# Patient Record
Sex: Male | Born: 1995 | Race: Black or African American | Hispanic: No | Marital: Single | Smoking: Never smoker
Health system: Southern US, Community
[De-identification: ages and names within clinical notes are randomized; demographics above are authoritative.]

## PROBLEM LIST (undated history)

## (undated) DIAGNOSIS — E119 Type 2 diabetes mellitus without complications: Secondary | ICD-10-CM

---

## 2019-12-08 ENCOUNTER — Emergency Department (HOSPITAL_COMMUNITY): Payer: Medicaid Other

## 2019-12-08 ENCOUNTER — Observation Stay (HOSPITAL_COMMUNITY)
Admission: EM | Admit: 2019-12-08 | Discharge: 2019-12-09 | Disposition: A | Discharge status: Home / Self Care | Payer: Medicaid Other | Attending: Internal Medicine | Admitting: Internal Medicine

## 2019-12-08 ENCOUNTER — Encounter (HOSPITAL_COMMUNITY): Payer: Self-pay | Admitting: *Deleted

## 2019-12-08 DIAGNOSIS — E860 Dehydration: Secondary | ICD-10-CM | POA: Diagnosis present

## 2019-12-08 DIAGNOSIS — E872 Acidosis, unspecified: Secondary | ICD-10-CM

## 2019-12-08 DIAGNOSIS — Z20822 Contact with and (suspected) exposure to covid-19: Secondary | ICD-10-CM | POA: Insufficient documentation

## 2019-12-08 DIAGNOSIS — R000 Tachycardia, unspecified: Secondary | ICD-10-CM

## 2019-12-08 DIAGNOSIS — R112 Nausea with vomiting, unspecified: Secondary | ICD-10-CM

## 2019-12-08 DIAGNOSIS — Z794 Long term (current) use of insulin: Secondary | ICD-10-CM | POA: Insufficient documentation

## 2019-12-08 DIAGNOSIS — E876 Hypokalemia: Secondary | ICD-10-CM | POA: Diagnosis present

## 2019-12-08 DIAGNOSIS — E1110 Type 2 diabetes mellitus with ketoacidosis without coma: Secondary | ICD-10-CM | POA: Diagnosis present

## 2019-12-08 DIAGNOSIS — E1010 Type 1 diabetes mellitus with ketoacidosis without coma: Secondary | ICD-10-CM | POA: Diagnosis present

## 2019-12-08 DIAGNOSIS — E8889 Other specified metabolic disorders: Secondary | ICD-10-CM

## 2019-12-08 DIAGNOSIS — E109 Type 1 diabetes mellitus without complications: Principal | ICD-10-CM | POA: Insufficient documentation

## 2019-12-08 HISTORY — DX: Type 2 diabetes mellitus without complications: E11.9

## 2019-12-08 LAB — BASIC METABOLIC PANEL
Anion gap: 12 (ref 5–15)
BUN: 17 mg/dL (ref 6–20)
CO2: 17 mmol/L — ABNORMAL LOW (ref 22–32)
Calcium: 9.3 mg/dL (ref 8.9–10.3)
Chloride: 109 mmol/L (ref 98–111)
Creatinine, Ser: 0.69 mg/dL (ref 0.61–1.24)
GFR calc Af Amer: >60 mL/min (ref >60)
GFR calc non Af Amer: >60 mL/min (ref >60)
Glucose, Bld: 106 mg/dL — ABNORMAL HIGH (ref 70–99)
Potassium: 3.4 mmol/L — ABNORMAL LOW (ref 3.5–5.1)
Sodium: 138 mmol/L (ref 135–145)

## 2019-12-08 LAB — URINALYSIS, ROUTINE W REFLEX MICROSCOPIC
Bacteria, UA: NONE SEEN
Bilirubin Urine: NEGATIVE
Glucose, UA: >=500 mg/dL — AB
Hgb urine dipstick: NEGATIVE
Ketones, ur: 80 mg/dL — AB
Leukocytes,Ua: NEGATIVE
Nitrite: NEGATIVE
Protein, ur: 30 mg/dL — AB
Specific Gravity, Urine: 1.026 (ref 1.005–1.030)
pH: 6 (ref 5.0–8.0)

## 2019-12-08 LAB — BLOOD GAS, VENOUS
Acid-base deficit: 11.4 mmol/L — ABNORMAL HIGH (ref 0.0–2.0)
Bicarbonate: 14.4 mmol/L — ABNORMAL LOW (ref 20.0–28.0)
O2 Saturation: 79.2 %
Patient temperature: 98.6
pCO2, Ven: 33.3 mmHg — ABNORMAL LOW (ref 44.0–60.0)
pH, Ven: 7.259 (ref 7.250–7.430)
pO2, Ven: 46.4 mmHg — ABNORMAL HIGH (ref 32.0–45.0)

## 2019-12-08 LAB — SARS CORONAVIRUS 2 BY RT PCR (HOSPITAL ORDER, PERFORMED IN ~~LOC~~ HOSPITAL LAB): SARS Coronavirus 2: NEGATIVE

## 2019-12-08 LAB — CBG MONITORING, ED
Glucose-Capillary: 264 mg/dL — ABNORMAL HIGH (ref 70–99)
Glucose-Capillary: 222 mg/dL — ABNORMAL HIGH (ref 70–99)

## 2019-12-08 MED ORDER — SODIUM CHLORIDE 0.9 % IV BOLUS
1000.0000 mL | Freq: Once | INTRAVENOUS | Status: AC
  Administered 2019-12-08: 1000 mL via INTRAVENOUS

## 2019-12-08 MED ORDER — DEXTROSE 50 % IV SOLN: 0.0000 mL | INTRAVENOUS | Status: DC | PRN

## 2019-12-08 MED ORDER — SODIUM CHLORIDE 0.9 % IV SOLN: INTRAVENOUS | Status: DC

## 2019-12-08 MED ORDER — DEXTROSE-NACL 5-0.45 % IV SOLN: INTRAVENOUS | Status: DC

## 2019-12-08 MED ORDER — LACTATED RINGERS IV BOLUS: 20.0000 mL/kg | Freq: Once | INTRAVENOUS | Status: DC

## 2019-12-08 MED ORDER — POTASSIUM CHLORIDE 10 MEQ/100ML IV SOLN
10.0000 meq | INTRAVENOUS | Status: AC
  Administered 2019-12-09 (×4): 10 meq via INTRAVENOUS
  Filled 2019-12-08 (×4): qty 100

## 2019-12-08 MED ORDER — ONDANSETRON HCL 4 MG/2ML IJ SOLN
4.0000 mg | Freq: Once | INTRAMUSCULAR | Status: AC
  Administered 2019-12-08: 4 mg via INTRAVENOUS
  Filled 2019-12-08: qty 2

## 2019-12-08 MED ORDER — INSULIN REGULAR(HUMAN) IN NACL 100-0.9 UT/100ML-% IV SOLN
INTRAVENOUS | Status: DC
  Administered 2019-12-09: 1 [IU]/h via INTRAVENOUS
  Administered 2019-12-09: 0.5 [IU]/h via INTRAVENOUS
  Filled 2019-12-08: qty 100

## 2019-12-08 MED ORDER — INSULIN ASPART 100 UNIT/ML ~~LOC~~ SOLN
5.0000 [IU] | Freq: Once | SUBCUTANEOUS | Status: AC
  Administered 2019-12-08: 5 [IU] via SUBCUTANEOUS
  Filled 2019-12-08: qty 0.05

## 2019-12-08 NOTE — ED Provider Notes (Signed)
[facility] DEPT Provider Note   CSN: [id number] Arrival date & time: 12/08/19  1245     History Chief Complaint  Patient presents with  . Fatigue    Craig Solis is a 24 y.o. male.  Patient is a 24 year old male with history of type 1 diabetes.  He presents today for evaluation of weakness and fatigue.  He describes decreased appetite.  His blood sugar at home has been reading somewhat higher than normal.  Per EMS, his blood sugar was 340.  He was given 1 L of normal saline in route and is feeling somewhat better.  He denies to me he is experiencing any fevers, chills, nausea, vomiting, or diarrhea.  The history is provided by the patient.       Past Medical History:  Diagnosis Date  . Diabetes mellitus without complication ([place])     There are no problems to display for this patient.   History reviewed. No pertinent surgical history.     No family history on file.  Social History   Tobacco Use  . Smoking status: Never Smoker  . Smokeless tobacco: Never Used  Substance Use Topics  . Alcohol use: Yes  . Drug use: Not Currently    Home Medications Prior to Admission medications   Not on File    Allergies    Patient has no allergy information on record.  Review of Systems   Review of Systems  All other systems reviewed and are negative.   Physical Exam Updated Vital Signs BP (!) 127/92 (BP Location: Right Arm)   Pulse (!) 104   Temp 98.2 F (36.8 C) (Oral)   Resp (!) 24   SpO2 100%   Physical Exam Vitals and nursing note reviewed.  Constitutional:      General: He is not in acute distress.    Appearance: He is well-developed. He is not diaphoretic.  HENT:     Head: Normocephalic and atraumatic.  Cardiovascular:     Rate and Rhythm: Normal rate and regular rhythm.     Heart sounds: No murmur. No friction rub.  Pulmonary:     Effort: Pulmonary effort is normal. No respiratory distress.     Breath sounds:  Normal breath sounds. No wheezing or rales.  Abdominal:     General: Bowel sounds are normal. There is no distension.     Palpations: Abdomen is soft.     Tenderness: There is no abdominal tenderness.  Musculoskeletal:        General: Normal range of motion.     Cervical back: Normal range of motion and neck supple.  Skin:    General: Skin is warm and dry.  Neurological:     Mental Status: He is alert and oriented to person, place, and time.     Coordination: Coordination normal.     ED Results / Procedures / Treatments   Labs (all labs ordered are listed, but only abnormal results are displayed) Labs Reviewed  CBG MONITORING, ED - Abnormal; Notable for the following components:      Result Value   Glucose-Capillary 264 (*)    All other components within normal limits  BASIC METABOLIC PANEL  CBC WITH DIFFERENTIAL/PLATELET    EKG None  Radiology No results found.  Procedures Procedures (including critical care time)  Medications Ordered in ED Medications  insulin aspart (novoLOG) injection 5 Units (has no administration in time range)  sodium chloride 0.9 % bolus 1,000 mL (has no administration in  time range)  sodium chloride 0.9 % bolus 1,000 mL (1,000 mLs Intravenous New Bag/Given 12/08/19 1453)    ED Course  I have reviewed the triage vital signs and the nursing notes.  Pertinent labs & imaging results that were available during my care of the patient were reviewed by me and considered in my medical decision making (see chart for details).    MDM Rules/Calculators/A&P  Patient with history of type 1 diabetes presenting with complaints of fatigue and weakness.  He reports decreased appetite.  Vital signs stable, but is somewhat tachycardic.  Initial blood sugars in the 300s.  IV fluids initiated and laboratory studies obtained revealing metabolic acidosis, but no elevated anion gap.  Patient was given 2 l of normal saline and will have his basic metabolic profile  repeated afterward.  Care to be signed out to Dr. Dalene Seltzer at shift change.  She will obtain the results of the BMP, reassess the patient, and determine the final disposition.  Final Clinical Impression(s) / ED Diagnoses Final diagnoses:  None    Rx / DC Orders ED Discharge Orders    None       Geoffery Lyons, MD 12/10/19 1530

## 2019-12-08 NOTE — H&P (Signed)
Craig Solis IBB:[id number] DOB: 14-Aug-1995 DOA: 12/08/2019   PCP: Patient, No Pcp Per   Outpatient Specialists:  NONE    Patient arrived to ER on 12/08/19 at 1245  Patient coming from: home Lives alone  Chief Complaint:  Chief Complaint  Patient presents with  . Fatigue    HPI: Craig Solis is a 24 y.o. male with medical history significant of type 1 diabetes    Presented with 2 days of weakness nausea abdominal discomfort He has been out of his insuline for 1 week due to insurance problems  initially on arrival of EMS blood sugar was 340 Was given 1 L normal saline in route Patient reports he has endocrinologist who he sees in [place] Infectious risk factors:  Reports  N/V abdominal pain,      Has NOt been vaccinated against COVID ( unsure if wants to, encouraged)  In  ER COVID TEST  NEGATIVE   Lab Results  Component Value Date   SARSCOV2NAA NEGATIVE 12/08/2019   Regarding pertinent Chronic problems:       DM 1 -  on insulin,     While in ER: Noted to have ketone urea Covid negative Anion gap 12 Blood sugars has improved down to 200s After administration of normal saline bolus and 5 units of insulin Pt likely  with undertreated DKA    Hospitalist was called for admission for DKA  The following Work up has been ordered so far:  Orders Placed This Encounter  Procedures  . SARS Coronavirus 2 by RT PCR (hospital order, performed in [facility] hospital lab) Nasopharyngeal Nasopharyngeal Swab  . Basic metabolic panel  . CBC with Differential  . Urinalysis, Routine w reflex microscopic  . Blood gas, venous (at [facility] and [facility], not at [facility])  . Basic metabolic panel  . Consult to hospitalist  ALL PATIENTS BEING ADMITTED/HAVING PROCEDURES NEED COVID-19 SCREENING  . CBG monitoring, ED  . POC CBG, ED     Following Medications were ordered in ER: Medications  sodium chloride 0.9 % bolus 1,000 mL (has no administration in time range)  ondansetron  (ZOFRAN) injection 4 mg (has no administration in time range)  sodium chloride 0.9 % bolus 1,000 mL (0 mLs Intravenous Stopped 12/08/19 1618)  insulin aspart (novoLOG) injection 5 Units (5 Units Subcutaneous Given 12/08/19 1702)  sodium chloride 0.9 % bolus 1,000 mL (0 mLs Intravenous Stopped 12/08/19 1702)        Consult Orders  (From admission, onward)         Start     Ordered   12/08/19 2302  Consult to hospitalist  ALL PATIENTS BEING ADMITTED/HAVING PROCEDURES NEED COVID-19 SCREENING  Once    Comments: ALL PATIENTS BEING ADMITTED/HAVING PROCEDURES NEED COVID-19 SCREENING  Provider:  (Not yet assigned)  Question Answer Comment  Place call to: [facility]   Reason for Consult Admit      12/08/19 2301         Significant initial  Findings: Abnormal Labs Reviewed  URINALYSIS, ROUTINE W REFLEX MICROSCOPIC - Abnormal; Notable for the following components:      Result Value   Glucose, UA >=500 (*)    Ketones, ur 80 (*)    Protein, ur 30 (*)    All other components within normal limits  BLOOD GAS, VENOUS - Abnormal; Notable for the following components:   pCO2, Ven 33.3 (*)    pO2, Ven 46.4 (*)    Bicarbonate 14.4 (*)    Acid-base  deficit 11.4 (*)    All other components within normal limits  BASIC METABOLIC PANEL - Abnormal; Notable for the following components:   Potassium 3.4 (*)    CO2 17 (*)    Glucose, Bld 106 (*)    All other components within normal limits  CBG MONITORING, ED - Abnormal; Notable for the following components:   Glucose-Capillary 264 (*)    All other components within normal limits  CBG MONITORING, ED - Abnormal; Notable for the following components:   Glucose-Capillary 222 (*)    All other components within normal limits    Otherwise labs showing:   Recent Labs  Lab 12/08/19 2032  NA 138  K 3.4*  CO2 17*  GLUCOSE 106*  [facility] 17  CREATININE 0.69  CALCIUM 9.3    Cr stable,  Lab Results  Component Value Date   CREATININE 0.69  12/08/2019    No results for input(s): AST, ALT, ALKPHOS, BILITOT, PROT, ALBUMIN in the last 168 hours. Lab Results  Component Value Date   CALCIUM 9.3 12/08/2019   WBC 15.5  Plt:357 Hg 14.9 COVID-19 Labs  No results for input(s): DDIMER, FERRITIN, LDH, CRP in the last 72 hours.  Lab Results  Component Value Date   SARSCOV2NAA NEGATIVE 12/08/2019     Venous  Blood Gas result:  pH  7.259 pCO2 33.3    ABG    Component Value Date/Time   HCO3 14.4 (L) 12/08/2019 1644   ACIDBASEDEF 11.4 (H) 12/08/2019 1644   O2SAT 79.2 12/08/2019 1644     Cardiac Panel (last 3 results) Recent Labs    12/08/19 2122  CKTOTAL 37*       ECG: Ordered      CBG (last 3)  Recent Labs    12/08/19 1248 12/08/19 1651 12/09/19 0010  GLUCAP 264* 222* 113*      UA  no evidence of UTI     Urine analysis:    Component Value Date/Time   COLORURINE YELLOW 12/08/2019 1930   APPEARANCEUR CLEAR 12/08/2019 1930   LABSPEC 1.026 12/08/2019 1930   PHURINE 6.0 12/08/2019 1930   GLUCOSEU >=500 (A) 12/08/2019 1930   HGBUR NEGATIVE 12/08/2019 1930   BILIRUBINUR NEGATIVE 12/08/2019 1930   KETONESUR 80 (A) 12/08/2019 1930   PROTEINUR 30 (A) 12/08/2019 1930   NITRITE NEGATIVE 12/08/2019 1930   LEUKOCYTESUR NEGATIVE 12/08/2019 1930      Ordered     CXR -  NON acute    ED Triage Vitals  Enc Vitals Group     BP 12/08/19 1511 (!) 127/92     Pulse Rate 12/08/19 1511 (!) 104     Resp 12/08/19 1511 (!) 24     Temp 12/08/19 1511 98.2 F (36.8 C)     Temp Source 12/08/19 1511 Oral     SpO2 12/08/19 1511 100 %     Weight --      Height --      Head Circumference --      Peak Flow --      Pain Score 12/08/19 1512 5     Pain Loc --      Pain Edu? --      Excl. in [place]? --   TMAX(24)@       Latest  Blood pressure 107/71, pulse (!) 106, temperature 98.2 F (36.8 C), temperature source Oral, resp. rate 18, SpO2 98 %.    Review of Systems:    Pertinent positives include: fatigue,  abdominal  pain,  nausea, vomiting  Constitutional:  No weight loss, night sweats, Fevers, chills,  weight loss  HEENT:  No headaches, Difficulty swallowing,Tooth/dental problems,Sore throat,  No sneezing, itching, ear ache, nasal congestion, post nasal drip,  Cardio-vascular:  No chest pain, Orthopnea, PND, anasarca, dizziness, palpitations.no Bilateral lower extremity swelling  GI:  No heartburn, indigestion,, diarrhea, change in bowel habits, loss of appetite, melena, blood in stool, hematemesis Resp:  no shortness of breath at rest. No dyspnea on exertion, No excess mucus, no productive cough, No non-productive cough, No coughing up of blood.No change in color of mucus.No wheezing. Skin:  no rash or lesions. No jaundice GU:  no dysuria, change in color of urine, no urgency or frequency. No straining to urinate.  No flank pain.  Musculoskeletal:  No joint pain or no joint swelling. No decreased range of motion. No back pain.  Psych:  No change in mood or affect. No depression or anxiety. No memory loss.  Neuro: no localizing neurological complaints, no tingling, no weakness, no double vision, no gait abnormality, no slurred speech, no confusion  All systems reviewed and apart from HOPI all are negative  Past Medical History:   Past Medical History:  Diagnosis Date  . Diabetes mellitus without complication (HCC)       History reviewed. No pertinent surgical history.  Social History:   Independently      reports that he has never smoked. He has never used smokeless tobacco. He reports current alcohol use. He reports previous drug use.   Family History:   Family History  Problem Relation Age of Onset  . Hypertension Neg Hx   . Diabetes Neg Hx     Allergies: No Known Allergies   Prior to Admission medications   Medication Sig Start Date End Date Taking? Authorizing Provider  insulin aspart (NOVOLOG FLEXPEN) 100 UNIT/ML FlexPen Inject 8 Units into the skin 3 (three) times  daily with meals. Sliding Scale   Yes [provider]  insulin degludec (TRESIBA FLEXTOUCH) 100 UNIT/ML FlexTouch Pen Inject 26 Units into the skin daily.   Yes [provider]   Physical Exam: Blood pressure 107/71, pulse (!) 106, temperature 98.2 F (36.8 C), temperature source Oral, resp. rate 18, SpO2 98 %. 1. General:  in No Acute distress    Chronically ill -appearing 2. Psychological: Alert and  Oriented 3. Head/ENT:    Dry Mucous Membranes                          Head Non traumatic, neck supple                          Poor Dentition 4. SKIN:  decreased Skin turgor,  Skin clean Dry and intact no rash 5. Heart: Regular rate and rhythm no Murmur, no Rub or gallop 6. Lungs:   no wheezes or crackles   7. Abdomen: Soft,  non-tender, Non distended  bowel sounds present 8. Lower extremities: no clubbing, cyanosis, no  edema 9. Neurologically Grossly intact, moving all 4 extremities equally  10. MSK: Normal range of motion   All other LABS:    No results for input(s): WBC, NEUTROABS, HGB, HCT, MCV, PLT in the last 168 hours.   Recent Labs  Lab 12/08/19 2032  NA 138  K 3.4*  CL 109  CO2 17*  GLUCOSE 106*  [facility] 17  CREATININE 0.69  CALCIUM 9.3  No results for input(s): AST, ALT, ALKPHOS, BILITOT, PROT, ALBUMIN in the last 168 hours.     Cultures: No results found for: SDES, SPECREQUEST, CULT, REPTSTATUS   Radiological Exams on Admission: No results found.  Chart has been reviewed   Assessment/Plan  24 y.o. male with medical history significant of type 1 diabetes  Admitted for  DKA1, dehydration  Present on Admission:  . DKA, type 1 (HCC) - will admit per DKA protocol, obtain serial BMET, start on glucosestabalizer, aggressive IVF initially given. But only 5 units of insulin was administered at first now BG is improved as well as AG but low bicarb persists  will Change IVF to D5 1/2Na now that BG <250 .  So far work up of possible causes of  DKA/HSS with CXR, ECG one set of  UA.  have been unremarkable  Most likely cause been noncompliance Monitor in Stepdown. Replace potassium as needed.    Consult diabetes coordinator Given low AG but evidence of persistent ketoacidosis will check albumin   . Hypokalemia - will replace and follow   . Dehydration - will rehydrate  Other plan as per orders.  DVT prophylaxis:   Lovenox     Code Status:  FULL CODE as per patient    I had personally discussed CODE STATUS with patient   Family Communication:   Family not at  Bedside    Disposition Plan:     To home once workup is complete and patient is stable   Following barriers for discharge:                            Electrolytes corrected DKA resolved                             Pain controlled with PO medications                                                            Will need to be able to tolerate PO                                                  Will need consultants to evaluate patient prior to discharge   EXPECT DC tomorrow                                    Diabetes care coordinator                             Consults called: none   Admission status:  ED Disposition    ED Disposition Condition Comment   Admit  [facility][id number]  Level of Care: Stepdown [14]  Admit to SDU based on following criteria: Hemodynamic compromise or significant risk of instability:  Patient requiring short term acute titration and management of vasoactive drips, and invasive monitoring (i.e., CVP and Arterial line).  Covid Evaluation: Confirmed COVID Negative  Diagnosis:  DKA (diabetic ketoacidoses) [facility]) [[id number]]  Admitting Physician: Therisa Doyne [[id number]]  Attending Physician: Therisa Doyne [[id number]]                 Obs      Level of care      SDU tele indefinitely please discontinue once patient no longer qualifies   Precautions: admitted as  Covid Negative     PPE: Used by  the provider:   P100  eye Goggles,  Gloves    Craig Solis 12/09/2019, 12:58 AM    [facility]     after 2 AM please page floor coverage PA If 7AM-7PM, please contact the day team taking care of the patient using [email or web address]   Patient was evaluated in the context of the global COVID-19 pandemic, which necessitated consideration that the patient might be at risk for infection with the SARS-CoV-2 virus that causes COVID-19. Institutional protocols and algorithms that pertain to the evaluation of patients at risk for COVID-19 are in a state of rapid change based on information released by regulatory bodies including the CDC and federal and state organizations. These policies and algorithms were followed during the patient's care.

## 2019-12-08 NOTE — ED Triage Notes (Signed)
Pt arrived during system downtime.  Pt arrived via EMS from home. Reports 2 days of weakness, voming, syncopal episodes. Hx DM 1. EMS CBG 341. Received 1 L NS en route. Also reports right flank pain.

## 2019-12-09 ENCOUNTER — Encounter (HOSPITAL_COMMUNITY): Payer: Self-pay | Admitting: Internal Medicine

## 2019-12-09 LAB — CBG MONITORING, ED
Glucose-Capillary: 100 mg/dL — ABNORMAL HIGH (ref 70–99)
Glucose-Capillary: 103 mg/dL — ABNORMAL HIGH (ref 70–99)
Glucose-Capillary: 113 mg/dL — ABNORMAL HIGH (ref 70–99)
Glucose-Capillary: 115 mg/dL — ABNORMAL HIGH (ref 70–99)
Glucose-Capillary: 118 mg/dL — ABNORMAL HIGH (ref 70–99)
Glucose-Capillary: 122 mg/dL — ABNORMAL HIGH (ref 70–99)
Glucose-Capillary: 124 mg/dL — ABNORMAL HIGH (ref 70–99)
Glucose-Capillary: 136 mg/dL — ABNORMAL HIGH (ref 70–99)
Glucose-Capillary: 136 mg/dL — ABNORMAL HIGH (ref 70–99)
Glucose-Capillary: 157 mg/dL — ABNORMAL HIGH (ref 70–99)
Glucose-Capillary: 171 mg/dL — ABNORMAL HIGH (ref 70–99)
Glucose-Capillary: 99 mg/dL (ref 70–99)

## 2019-12-09 LAB — BASIC METABOLIC PANEL
Anion gap: 14 (ref 5–15)
Anion gap: 9 (ref 5–15)
Anion gap: 9 (ref 5–15)
Anion gap: 8 (ref 5–15)
BUN: 18 mg/dL (ref 6–20)
BUN: 15 mg/dL (ref 6–20)
BUN: 14 mg/dL (ref 6–20)
BUN: 12 mg/dL (ref 6–20)
CO2: 13 mmol/L — ABNORMAL LOW (ref 22–32)
CO2: 18 mmol/L — ABNORMAL LOW (ref 22–32)
CO2: 18 mmol/L — ABNORMAL LOW (ref 22–32)
CO2: 22 mmol/L (ref 22–32)
Calcium: 8.8 mg/dL — ABNORMAL LOW (ref 8.9–10.3)
Calcium: 8.5 mg/dL — ABNORMAL LOW (ref 8.9–10.3)
Calcium: 8.6 mg/dL — ABNORMAL LOW (ref 8.9–10.3)
Calcium: 8.7 mg/dL — ABNORMAL LOW (ref 8.9–10.3)
Chloride: 108 mmol/L (ref 98–111)
Chloride: 108 mmol/L (ref 98–111)
Chloride: 109 mmol/L (ref 98–111)
Chloride: 106 mmol/L (ref 98–111)
Creatinine, Ser: 0.81 mg/dL (ref 0.61–1.24)
Creatinine, Ser: 0.64 mg/dL (ref 0.61–1.24)
Creatinine, Ser: 0.48 mg/dL — ABNORMAL LOW (ref 0.61–1.24)
Creatinine, Ser: 0.59 mg/dL — ABNORMAL LOW (ref 0.61–1.24)
GFR calc Af Amer: >60 mL/min (ref >60)
GFR calc Af Amer: >60 mL/min (ref >60)
GFR calc Af Amer: >60 mL/min (ref >60)
GFR calc Af Amer: >60 mL/min (ref >60)
GFR calc non Af Amer: >60 mL/min (ref >60)
GFR calc non Af Amer: >60 mL/min (ref >60)
GFR calc non Af Amer: >60 mL/min (ref >60)
GFR calc non Af Amer: >60 mL/min (ref >60)
Glucose, Bld: 285 mg/dL — ABNORMAL HIGH (ref 70–99)
Glucose, Bld: 139 mg/dL — ABNORMAL HIGH (ref 70–99)
Glucose, Bld: 87 mg/dL (ref 70–99)
Glucose, Bld: 145 mg/dL — ABNORMAL HIGH (ref 70–99)
Potassium: 4.1 mmol/L (ref 3.5–5.1)
Potassium: 3.6 mmol/L (ref 3.5–5.1)
Potassium: 3.2 mmol/L — ABNORMAL LOW (ref 3.5–5.1)
Potassium: 3.1 mmol/L — ABNORMAL LOW (ref 3.5–5.1)
Sodium: 135 mmol/L (ref 135–145)
Sodium: 135 mmol/L (ref 135–145)
Sodium: 136 mmol/L (ref 135–145)
Sodium: 136 mmol/L (ref 135–145)

## 2019-12-09 LAB — RAPID URINE DRUG SCREEN, HOSP PERFORMED
Amphetamines: NOT DETECTED
Barbiturates: NOT DETECTED
Benzodiazepines: NOT DETECTED
Cocaine: NOT DETECTED
Opiates: NOT DETECTED
Tetrahydrocannabinol: NOT DETECTED

## 2019-12-09 LAB — HEPATIC FUNCTION PANEL
ALT: 13 U/L (ref 0–44)
AST: 12 U/L — ABNORMAL LOW (ref 15–41)
Albumin: 4 g/dL (ref 3.5–5.0)
Alkaline Phosphatase: 57 U/L (ref 38–126)
Bilirubin, Direct: <0.1 mg/dL (ref 0.0–0.2)
Total Bilirubin: 0.8 mg/dL (ref 0.3–1.2)
Total Protein: 6.9 g/dL (ref 6.5–8.1)

## 2019-12-09 LAB — CBC WITH DIFFERENTIAL/PLATELET
Abs Immature Granulocytes: 0.07 10*3/uL (ref 0.00–0.07)
Abs Immature Granulocytes: 0.02 10*3/uL (ref 0.00–0.07)
Basophils Absolute: 0 10*3/uL (ref 0.0–0.1)
Basophils Absolute: 0 10*3/uL (ref 0.0–0.1)
Basophils Relative: 0 %
Basophils Relative: 0 %
Eosinophils Absolute: 0 10*3/uL (ref 0.0–0.5)
Eosinophils Absolute: 0 10*3/uL (ref 0.0–0.5)
Eosinophils Relative: 0 %
Eosinophils Relative: 0 %
HCT: 45.3 % (ref 39.0–52.0)
HCT: 38.2 % — ABNORMAL LOW (ref 39.0–52.0)
Hemoglobin: 14.9 g/dL (ref 13.0–17.0)
Hemoglobin: 12.8 g/dL — ABNORMAL LOW (ref 13.0–17.0)
Immature Granulocytes: 1 %
Immature Granulocytes: 0 %
Lymphocytes Relative: 4 %
Lymphocytes Relative: 13 %
Lymphs Abs: 0.7 10*3/uL (ref 0.7–4.0)
Lymphs Abs: 1.4 10*3/uL (ref 0.7–4.0)
MCH: 27.1 pg (ref 26.0–34.0)
MCH: 27.4 pg (ref 26.0–34.0)
MCHC: 32.9 g/dL (ref 30.0–36.0)
MCHC: 33.5 g/dL (ref 30.0–36.0)
MCV: 82.4 fL (ref 80.0–100.0)
MCV: 81.6 fL (ref 80.0–100.0)
Monocytes Absolute: 1 10*3/uL (ref 0.1–1.0)
Monocytes Absolute: 1.2 10*3/uL — ABNORMAL HIGH (ref 0.1–1.0)
Monocytes Relative: 7 %
Monocytes Relative: 11 %
Neutro Abs: 13.7 10*3/uL — ABNORMAL HIGH (ref 1.7–7.7)
Neutro Abs: 8 10*3/uL — ABNORMAL HIGH (ref 1.7–7.7)
Neutrophils Relative %: 88 %
Neutrophils Relative %: 76 %
Platelets: 357 10*3/uL (ref 150–400)
Platelets: 295 10*3/uL (ref 150–400)
RBC: 5.5 MIL/uL (ref 4.22–5.81)
RBC: 4.68 MIL/uL (ref 4.22–5.81)
RDW: 12.8 % (ref 11.5–15.5)
RDW: 13.2 % (ref 11.5–15.5)
WBC: 15.5 10*3/uL — ABNORMAL HIGH (ref 4.0–10.5)
WBC: 10.6 10*3/uL — ABNORMAL HIGH (ref 4.0–10.5)
nRBC: 0 % (ref 0.0–0.2)
nRBC: 0 % (ref 0.0–0.2)

## 2019-12-09 LAB — BETA-HYDROXYBUTYRIC ACID
Beta-Hydroxybutyric Acid: 2.99 mmol/L — ABNORMAL HIGH (ref 0.05–0.27)
Beta-Hydroxybutyric Acid: 3.57 mmol/L — ABNORMAL HIGH (ref 0.05–0.27)

## 2019-12-09 LAB — ETHANOL: Alcohol, Ethyl (B): <10 mg/dL (ref <10)

## 2019-12-09 LAB — PHOSPHORUS: Phosphorus: 1.6 mg/dL — ABNORMAL LOW (ref 2.5–4.6)

## 2019-12-09 LAB — LACTIC ACID, PLASMA: Lactic Acid, Venous: 0.9 mmol/L (ref 0.5–1.9)

## 2019-12-09 LAB — HEMOGLOBIN A1C
Hgb A1c MFr Bld: 13.4 % — ABNORMAL HIGH (ref 4.8–5.6)
Mean Plasma Glucose: 337.88 mg/dL

## 2019-12-09 LAB — LIPASE, BLOOD: Lipase: 15 U/L (ref 11–51)

## 2019-12-09 LAB — MAGNESIUM: Magnesium: 2.1 mg/dL (ref 1.7–2.4)

## 2019-12-09 LAB — HIV ANTIBODY (ROUTINE TESTING W REFLEX): HIV Screen 4th Generation wRfx: NONREACTIVE

## 2019-12-09 LAB — PREALBUMIN: Prealbumin: 10.9 mg/dL — ABNORMAL LOW (ref 18–38)

## 2019-12-09 LAB — CK: Total CK: 37 U/L — ABNORMAL LOW (ref 49–397)

## 2019-12-09 MED ORDER — INSULIN GLARGINE 100 UNIT/ML ~~LOC~~ SOLN: 20.0000 [IU] | Freq: Every day | SUBCUTANEOUS | Status: DC

## 2019-12-09 MED ORDER — INSULIN ASPART 100 UNIT/ML ~~LOC~~ SOLN
5.0000 [IU] | Freq: Three times a day (TID) | SUBCUTANEOUS | Status: DC
  Filled 2019-12-09: qty 0.05

## 2019-12-09 MED ORDER — INSULIN GLARGINE 100 UNIT/ML ~~LOC~~ SOLN
10.0000 [IU] | Freq: Every day | SUBCUTANEOUS | Status: DC
  Administered 2019-12-09: 10 [IU] via SUBCUTANEOUS
  Filled 2019-12-09: qty 0.1

## 2019-12-09 MED ORDER — POTASSIUM CHLORIDE CRYS ER 20 MEQ PO TBCR
40.0000 meq | EXTENDED_RELEASE_TABLET | Freq: Once | ORAL | Status: AC
  Administered 2019-12-09: 40 meq via ORAL
  Filled 2019-12-09: qty 2

## 2019-12-09 MED ORDER — INSULIN ASPART 100 UNIT/ML ~~LOC~~ SOLN
0.0000 [IU] | Freq: Three times a day (TID) | SUBCUTANEOUS | Status: DC
  Filled 2019-12-09: qty 0.09

## 2019-12-09 MED ORDER — ENOXAPARIN SODIUM 40 MG/0.4ML ~~LOC~~ SOLN
40.0000 mg | SUBCUTANEOUS | Status: DC
  Administered 2019-12-09: 40 mg via SUBCUTANEOUS
  Filled 2019-12-09: qty 0.4

## 2019-12-09 NOTE — Progress Notes (Signed)
Inpatient Diabetes Program Recommendations  AACE/ADA: New Consensus Statement on Inpatient Glycemic Control (2015)  Target Ranges:  Prepandial:   less than 140 mg/dL      Peak postprandial:   less than 180 mg/dL (1-2 hours)      Critically ill patients:  140 - 180 mg/dL   Lab Results  Component Value Date   GLUCAP 157 (H) 12/09/2019   HGBA1C 13.4 (H) 12/09/2019    Review of Glycemic Control  Diabetes history: DM1 Outpatient Diabetes medications: Tresiba 26 units QD, Novolog 8 units tidwc Current orders for Inpatient glycemic control: IV insulin transitioning to Lantus 10 units QD  HgbA1C - 13.4% - uncontrolled Hasn't taken insulin in over a week  Inpatient Diabetes Program Recommendations:     Add Novolog 0-9 units tidwc and hs Add Novolog 5 units tidwc for meal coverage insulin (pt is Type 1 and makes no insulin) Will likely need to increase Lantus to 20 units QD.  Will speak with pt about his glycemic control PTA and HgbA1C of 13.4% this afternoon.   Continue to follow closely.  Thank you. Ailene Ards, RD, LDN, CDE Inpatient Diabetes Coordinator [phone]

## 2019-12-09 NOTE — Progress Notes (Signed)
Inpatient Diabetes Program Recommendations  AACE/ADA: New Consensus Statement on Inpatient Glycemic Control (2015)  Target Ranges:  Prepandial:   less than 140 mg/dL      Peak postprandial:   less than 180 mg/dL (1-2 hours)      Critically ill patients:  140 - 180 mg/dL   Lab Results  Component Value Date   GLUCAP 136 (H) 12/09/2019   HGBA1C 13.4 (H) 12/09/2019    Review of Glycemic Control  Spoke with pt at bedside regarding his diabetes and HgbA1C of 13.4% Pt said he ran out of his insulin about 1 week ago. Does not have insurance and has taken smaller doses to make it last longer. States he has received several insulin pens (both [place] and [place]) from [employer] and is applying for medication assistance through some type of program. Endo is in [place]. Stressed importance of f/u after hospital discharge. Discussed importance of checking blood sugars several times/day and calling MD if blood sugars > 200 mg/dL on a regular basis. Discussed if pt runs out of insulin, he can go to [employer] and purchase [employer] brand of 70/30 for $44 (5 pens). Pt has done this in the past. Discussed diet, exercise and stress management, and how they affect blood sugars.  For discharge:  Inpatient Diabetes Program Recommendations:     Tresiba 26 units QD Fiasp 8 units tidwc  F/U with Endo within week of hospital discharge.  Thank you. Ailene Ards, RD, LDN, CDE Inpatient Diabetes Coordinator [phone]

## 2019-12-09 NOTE — Progress Notes (Signed)
Physician Discharge Summary  Craig Solis AJO:[id number] DOB: [birth year]   PCP: Patient, No Pcp Per  Admit date: 12/08/2019 Discharge date: 12/09/2019 Length of Stay: 0 days   Code Status: Full Code  Admitted From:  Home Discharged to:   Home Home Health:  None  Equipment/Devices:  None Discharge Condition:  Stable  Recommendations for Outpatient Follow-up   1. Follow up with PCP and endocrinology  Hospital Summary  This is a 24 year old male with past medical history of type 1 diabetes who presented with 2 days of weakness, nausea, abdominal discomfort and noted to be out of his insulin for the past 1 week due to insurance issues.  Blood sugar was 340 per EMS and he was given 1 L normal saline in route to hospital.  Patient has an endocrinologist who he sees in [place].  He was admitted for DKA and treated with aggressive IV fluids as well as insulin drip.  Patient's DKA had resolved on 5/28 was transitioned to subcutaneous insulin and tolerated p.o. intake.  Advised to continue his current insulin regimen and have close outpatient followup  A & P   Active Problems:   Dehydration   DKA, type 1 (HCC)   Hypokalemia   DKA (diabetic ketoacidoses) (HCC)   1. DKA in setting of type 1 diabetes secondary to being out of insulin x1 week 1. DKA resolved 2. HA1C 13.4 3. Diabetic coordinator consulted, continue home regimen. Patient has insulin at home now and advised to have close follow up with his endocrinologist  2. Hypokalemia 1. Given supplementation 2. Follow-up BMP outpatient and encourage p.o. intake  3. Dehydration 1. Resolved with IV fluids 2. Tolerating p.o. intake     Consultants  . None  Procedures  . None  Antibiotics   Anti-infectives (From admission, onward)   None       Subjective  Patient seen and examined at bedside no acute distress and resting comfortably.  No events overnight.   Symptoms have significantly improved and nearly resolved.   Diet has been advanced and has been tolerated.  Denies any chest pain, shortness of breath, fever, nausea, vomiting, urinary or bowel complaints. Otherwise ROS negative    Objective   Discharge Exam: Vitals:   12/09/19 1403 12/09/19 1430  BP: 116/82 121/82  Pulse: 82 97  Resp: 17 14  Temp:    SpO2: 98% 99%   Vitals:   12/09/19 1330 12/09/19 1400 12/09/19 1403 12/09/19 1430  BP: 113/81 116/82 116/82 121/82  Pulse: 82 84 82 97  Resp: 17 12 17 14   Temp:      TempSrc:      SpO2: 98% 97% 98% 99%  Weight:      Height:        Physical Exam Vitals and nursing note reviewed.  Constitutional:      Appearance: Normal appearance.  HENT:     Head: Normocephalic and atraumatic.  Eyes:     Conjunctiva/sclera: Conjunctivae normal.  Cardiovascular:     Rate and Rhythm: Normal rate and regular rhythm.  Pulmonary:     Effort: Pulmonary effort is normal.     Breath sounds: Normal breath sounds.  Abdominal:     General: Abdomen is flat.     Palpations: Abdomen is soft.  Musculoskeletal:        General: No swelling or tenderness.  Skin:    Coloration: Skin is not jaundiced or pale.  Neurological:     Mental Status: He is alert. Mental status is  at baseline.  Psychiatric:        Mood and Affect: Mood normal.        Behavior: Behavior normal.       The results of significant diagnostics from this hospitalization (including imaging, microbiology, ancillary and laboratory) are listed below for reference.     Microbiology: Recent Results (from the past 240 hour(s))  SARS Coronavirus 2 by RT PCR (hospital order, performed in [facility] hospital lab) Nasopharyngeal Nasopharyngeal Swab     Status: None   Collection Time: 12/08/19  4:55 PM   Specimen: Nasopharyngeal Swab  Result Value Ref Range Status   SARS Coronavirus 2 NEGATIVE NEGATIVE Final    Comment: (NOTE) SARS-CoV-2 target nucleic acids are NOT DETECTED. The SARS-CoV-2 RNA is generally detectable in upper and  lower respiratory specimens during the acute phase of infection. The lowest concentration of SARS-CoV-2 viral copies this assay can detect is 250 copies / mL. A negative result does not preclude SARS-CoV-2 infection and should not be used as the sole basis for treatment or other patient management decisions.  A negative result may occur with improper specimen collection / handling, submission of specimen other than nasopharyngeal swab, presence of viral mutation(s) within the areas targeted by this assay, and inadequate number of viral copies (<250 copies / mL). A negative result must be combined with clinical observations, patient history, and epidemiological information. Fact Sheet for Patients:   BoilerBrush.com.cy Fact Sheet for Healthcare Providers: https://pope.com/ This test is not yet approved or cleared  by the [place] FDA and has been authorized for detection and/or diagnosis of SARS-CoV-2 by FDA under an Emergency Use Authorization (EUA).  This EUA will remain in effect (meaning this test can be used) for the duration of the COVID-19 declaration under Section 564(b)(1) of the Act, 21 U.S.C. section 360bbb-3(b)(1), unless the authorization is terminated or revoked sooner. Performed at [facility], [address] [place], [place] [address]      Labs: BNP (last 3 results) No results for input(s): BNP in the last 8760 hours. Basic Metabolic Panel: Recent Labs  Lab 12/08/19 1351 12/08/19 2032 12/08/19 2122 12/09/19 0200 12/09/19 0429 12/09/19 1135  NA 135 138  --  135 136 136  K 4.1 3.4*  --  3.6 3.2* 3.1*  CL 108 109  --  108 109 106  CO2 13* 17*  --  18* 18* 22  GLUCOSE 285* 106*  --  139* 87 145*  BUN 18 17  --  15 14 12   CREATININE 0.81 0.69  --  0.64 0.48* 0.59*  CALCIUM 8.8* 9.3  --  8.5* 8.6* 8.7*  MG  --   --  2.1  --   --   --   PHOS  --   --  1.6*  --   --   --    Liver Function  Tests: Recent Labs  Lab 12/09/19 0030  AST 12*  ALT 13  ALKPHOS 57  BILITOT 0.8  PROT 6.9  ALBUMIN 4.0   Recent Labs  Lab 12/08/19 2122  LIPASE 15   No results for input(s): AMMONIA in the last 168 hours. CBC: Recent Labs  Lab 12/08/19 1351 12/09/19 0429  WBC 15.5* 10.6*  NEUTROABS 13.7* 8.0*  HGB 14.9 12.8*  HCT 45.3 38.2*  MCV 82.4 81.6  PLT 357 295   Cardiac Enzymes: Recent Labs  Lab 12/08/19 2122  CKTOTAL 37*   BNP: Invalid input(s): POCBNP CBG: Recent Labs  Lab  12/09/19 0615 12/09/19 0730 12/09/19 0840 12/09/19 1039 12/09/19 1139  GLUCAP 124* 118* 122* 157* 136*   D-Dimer No results for input(s): DDIMER in the last 72 hours. Hgb A1c Recent Labs    12/09/19 0454  HGBA1C 13.4*   Lipid Profile No results for input(s): CHOL, HDL, LDLCALC, TRIG, CHOLHDL, LDLDIRECT in the last 72 hours. Thyroid function studies No results for input(s): TSH, T4TOTAL, T3FREE, THYROIDAB in the last 72 hours.  Invalid input(s): FREET3 Anemia work up No results for input(s): VITAMINB12, FOLATE, FERRITIN, TIBC, IRON, RETICCTPCT in the last 72 hours. Urinalysis    Component Value Date/Time   COLORURINE YELLOW 12/08/2019 1930   APPEARANCEUR CLEAR 12/08/2019 1930   LABSPEC 1.026 12/08/2019 1930   PHURINE 6.0 12/08/2019 1930   GLUCOSEU >=500 (A) 12/08/2019 1930   HGBUR NEGATIVE 12/08/2019 1930   BILIRUBINUR NEGATIVE 12/08/2019 1930   KETONESUR 80 (A) 12/08/2019 1930   PROTEINUR 30 (A) 12/08/2019 1930   NITRITE NEGATIVE 12/08/2019 1930   LEUKOCYTESUR NEGATIVE 12/08/2019 1930   Sepsis Labs Invalid input(s): PROCALCITONIN,  WBC,  LACTICIDVEN Microbiology Recent Results (from the past 240 hour(s))  SARS Coronavirus 2 by RT PCR (hospital order, performed in [facility] hospital lab) Nasopharyngeal Nasopharyngeal Swab     Status: None   Collection Time: 12/08/19  4:55 PM   Specimen: Nasopharyngeal Swab  Result Value Ref Range Status   SARS Coronavirus 2 NEGATIVE  NEGATIVE Final    Comment: (NOTE) SARS-CoV-2 target nucleic acids are NOT DETECTED. The SARS-CoV-2 RNA is generally detectable in upper and lower respiratory specimens during the acute phase of infection. The lowest concentration of SARS-CoV-2 viral copies this assay can detect is 250 copies / mL. A negative result does not preclude SARS-CoV-2 infection and should not be used as the sole basis for treatment or other patient management decisions.  A negative result may occur with improper specimen collection / handling, submission of specimen other than nasopharyngeal swab, presence of viral mutation(s) within the areas targeted by this assay, and inadequate number of viral copies (<250 copies / mL). A negative result must be combined with clinical observations, patient history, and epidemiological information. Fact Sheet for Patients:   BoilerBrush.com.cy Fact Sheet for Healthcare Providers: https://pope.com/ This test is not yet approved or cleared  by the [place] FDA and has been authorized for detection and/or diagnosis of SARS-CoV-2 by FDA under an Emergency Use Authorization (EUA).  This EUA will remain in effect (meaning this test can be used) for the duration of the COVID-19 declaration under Section 564(b)(1) of the Act, 21 U.S.C. section 360bbb-3(b)(1), unless the authorization is terminated or revoked sooner. Performed at [facility], [address] [place], [place] [address]     Discharge Instructions     Discharge Instructions    Diet - low sodium heart healthy   Complete by: As directed    Discharge instructions   Complete by: As directed    - continue your current insulin regimen - if you run out of insulin you pick up ReliOn Insulin 70/30 for $44 for 5 pens.  - make sure you follow up with your endocrinologist next week - check your blood sugar regularly and if it is consistently over  200 then contact your doctor.  - if you have any recurrence of your symptoms then return to the ED.   Increase activity slowly   Complete by: As directed      Allergies as of 12/09/2019   No Known  Allergies     Medication List    TAKE these medications   NovoLOG FlexPen 100 UNIT/ML FlexPen Generic drug: insulin aspart Inject 8 Units into the skin 3 (three) times daily with meals. Sliding Scale   Tresiba FlexTouch 100 UNIT/ML FlexTouch Pen Generic drug: insulin degludec Inject 26 Units into the skin daily.       No Known Allergies   Dispo: The patient is from: Home              Anticipated d/c is to: Home              Anticipated d/c date is: today              Patient currently is medically stable to d/c.       Time coordinating discharge: Over 30 minutes   SIGNED:   Harold Hedge, D.O. [facility] Pager: [phone]  12/09/2019, 3:52 PM

## 2019-12-09 NOTE — ED Notes (Signed)
Incorrect charting by this Clinical research associate. BMP, Prealbumin, and A1C was collected at 0436.

## 2019-12-09 NOTE — ED Provider Notes (Signed)
  Physical Exam  BP 121/82   Pulse 97   Temp 98.2 F (36.8 C) (Oral)   Resp 14   Ht 5\' 9"  (1.753 m)   Wt 59 kg   SpO2 99%   BMI 19.20 kg/m   Physical Exam  ED Course/Procedures     Procedures  MDM  Received care of pt from Dr. . Please see his note for prior history and physical.   Briefly, this is a 24yo male with history of Type 1 DM who presents with fatigue, nausea, vomiting, hyperglycemia and tachycardia.   Had right flank pain but reports it improved with fluids in the ED.  Has been out of long acting insulin.   Initial labs show nonanion gap metabolic acidosis with bicarb of 13, UA pending.  Plan to give insulin, recheck labs as not clearly in DKA without result for ketones at this time and no AG acidosis.   VBG shows mild acidosis with respiratory compensation.  UA returned showing 80 ketones, Bicarb improved to 17 on recheck. He continues to have nausea, tachcyardia.  Suspect the dyspnea he has had is secondary to acidosis and doubt PE. No chest pain. No other infectious symptoms. COVID negative.   Possible other viral etiology but suspect mild DKA with risk of worsening in setting of acidosis, ketonuria, continued tachycardia and nausea. Discussed with Dr. 24yo and will initiate D5 and insulin gtt for DKA protocol and admit for further care.     Adela Glimpse, MD 12/09/19 [phone]

## 2020-11-06 ENCOUNTER — Other Ambulatory Visit: Payer: Self-pay

## 2020-11-06 ENCOUNTER — Inpatient Hospital Stay (HOSPITAL_COMMUNITY): Payer: Self-pay

## 2020-11-06 ENCOUNTER — Encounter (HOSPITAL_COMMUNITY): Payer: Self-pay

## 2020-11-06 ENCOUNTER — Inpatient Hospital Stay (HOSPITAL_COMMUNITY)
Admission: EM | Admit: 2020-11-06 | Discharge: 2020-11-08 | DRG: 638 | Disposition: A | Discharge status: Home / Self Care | Payer: Self-pay | Attending: Internal Medicine | Admitting: Internal Medicine

## 2020-11-06 DIAGNOSIS — N179 Acute kidney failure, unspecified: Secondary | ICD-10-CM

## 2020-11-06 DIAGNOSIS — E871 Hypo-osmolality and hyponatremia: Secondary | ICD-10-CM | POA: Diagnosis present

## 2020-11-06 DIAGNOSIS — Z9114 Patient's other noncompliance with medication regimen: Secondary | ICD-10-CM

## 2020-11-06 DIAGNOSIS — Z20822 Contact with and (suspected) exposure to covid-19: Secondary | ICD-10-CM | POA: Diagnosis present

## 2020-11-06 DIAGNOSIS — Z9119 Patient's noncompliance with other medical treatment and regimen: Secondary | ICD-10-CM

## 2020-11-06 DIAGNOSIS — E1010 Type 1 diabetes mellitus with ketoacidosis without coma: Principal | ICD-10-CM | POA: Diagnosis present

## 2020-11-06 DIAGNOSIS — E1110 Type 2 diabetes mellitus with ketoacidosis without coma: Secondary | ICD-10-CM | POA: Insufficient documentation

## 2020-11-06 DIAGNOSIS — Z794 Long term (current) use of insulin: Secondary | ICD-10-CM

## 2020-11-06 DIAGNOSIS — E861 Hypovolemia: Secondary | ICD-10-CM | POA: Diagnosis present

## 2020-11-06 DIAGNOSIS — D72829 Elevated white blood cell count, unspecified: Secondary | ICD-10-CM | POA: Diagnosis present

## 2020-11-06 LAB — COMPREHENSIVE METABOLIC PANEL
ALT: 19 U/L (ref 0–44)
AST: 13 U/L — ABNORMAL LOW (ref 15–41)
Albumin: 4.3 g/dL (ref 3.5–5.0)
Alkaline Phosphatase: 115 U/L (ref 38–126)
BUN: 38 mg/dL — ABNORMAL HIGH (ref 6–20)
CO2: <7 mmol/L — ABNORMAL LOW (ref 22–32)
Calcium: 8.9 mg/dL (ref 8.9–10.3)
Chloride: 92 mmol/L — ABNORMAL LOW (ref 98–111)
Creatinine, Ser: 1.44 mg/dL — ABNORMAL HIGH (ref 0.61–1.24)
GFR, Estimated: >60 mL/min (ref >60)
Glucose, Bld: 565 mg/dL (ref 70–99)
Potassium: 5.1 mmol/L (ref 3.5–5.1)
Sodium: 125 mmol/L — ABNORMAL LOW (ref 135–145)
Total Bilirubin: 2 mg/dL — ABNORMAL HIGH (ref 0.3–1.2)
Total Protein: 8.8 g/dL — ABNORMAL HIGH (ref 6.5–8.1)

## 2020-11-06 LAB — GLUCOSE, CAPILLARY
Glucose-Capillary: 205 mg/dL — ABNORMAL HIGH (ref 70–99)
Glucose-Capillary: 191 mg/dL — ABNORMAL HIGH (ref 70–99)
Glucose-Capillary: 179 mg/dL — ABNORMAL HIGH (ref 70–99)
Glucose-Capillary: 159 mg/dL — ABNORMAL HIGH (ref 70–99)
Glucose-Capillary: 181 mg/dL — ABNORMAL HIGH (ref 70–99)
Glucose-Capillary: 152 mg/dL — ABNORMAL HIGH (ref 70–99)
Glucose-Capillary: 166 mg/dL — ABNORMAL HIGH (ref 70–99)

## 2020-11-06 LAB — CBC WITH DIFFERENTIAL/PLATELET
Abs Immature Granulocytes: 0.11 10*3/uL — ABNORMAL HIGH (ref 0.00–0.07)
Basophils Absolute: 0.1 10*3/uL (ref 0.0–0.1)
Basophils Relative: 0 %
Eosinophils Absolute: 0 10*3/uL (ref 0.0–0.5)
Eosinophils Relative: 0 %
HCT: 45.7 % (ref 39.0–52.0)
Hemoglobin: 13.9 g/dL (ref 13.0–17.0)
Immature Granulocytes: 1 %
Lymphocytes Relative: 4 %
Lymphs Abs: 1 10*3/uL (ref 0.7–4.0)
MCH: 26.2 pg (ref 26.0–34.0)
MCHC: 30.4 g/dL (ref 30.0–36.0)
MCV: 86.2 fL (ref 80.0–100.0)
Monocytes Absolute: 1.5 10*3/uL — ABNORMAL HIGH (ref 0.1–1.0)
Monocytes Relative: 7 %
Neutro Abs: 18.9 10*3/uL — ABNORMAL HIGH (ref 1.7–7.7)
Neutrophils Relative %: 88 %
Platelets: 468 10*3/uL — ABNORMAL HIGH (ref 150–400)
RBC: 5.3 MIL/uL (ref 4.22–5.81)
RDW: 14.1 % (ref 11.5–15.5)
WBC: 21.5 10*3/uL — ABNORMAL HIGH (ref 4.0–10.5)
nRBC: 0 % (ref 0.0–0.2)

## 2020-11-06 LAB — CBG MONITORING, ED
Glucose-Capillary: 552 mg/dL (ref 70–99)
Glucose-Capillary: 451 mg/dL — ABNORMAL HIGH (ref 70–99)
Glucose-Capillary: 329 mg/dL — ABNORMAL HIGH (ref 70–99)
Glucose-Capillary: 269 mg/dL — ABNORMAL HIGH (ref 70–99)
Glucose-Capillary: 195 mg/dL — ABNORMAL HIGH (ref 70–99)
Glucose-Capillary: 215 mg/dL — ABNORMAL HIGH (ref 70–99)

## 2020-11-06 LAB — URINALYSIS, ROUTINE W REFLEX MICROSCOPIC
Bilirubin Urine: NEGATIVE
Glucose, UA: >=500 mg/dL — AB
Hgb urine dipstick: NEGATIVE
Ketones, ur: 80 mg/dL — AB
Leukocytes,Ua: NEGATIVE
Nitrite: NEGATIVE
Protein, ur: 30 mg/dL — AB
Specific Gravity, Urine: 1.021 (ref 1.005–1.030)
pH: 5 (ref 5.0–8.0)

## 2020-11-06 LAB — BASIC METABOLIC PANEL
Anion gap: 14 (ref 5–15)
BUN: 21 mg/dL — ABNORMAL HIGH (ref 6–20)
CO2: 11 mmol/L — ABNORMAL LOW (ref 22–32)
Calcium: 7.9 mg/dL — ABNORMAL LOW (ref 8.9–10.3)
Chloride: 111 mmol/L (ref 98–111)
Creatinine, Ser: 0.75 mg/dL (ref 0.61–1.24)
GFR, Estimated: >60 mL/min (ref >60)
Glucose, Bld: 210 mg/dL — ABNORMAL HIGH (ref 70–99)
Potassium: 3.7 mmol/L (ref 3.5–5.1)
Sodium: 136 mmol/L (ref 135–145)

## 2020-11-06 LAB — BLOOD GAS, VENOUS
Acid-base deficit: 24.4 mmol/L — ABNORMAL HIGH (ref 0.0–2.0)
Bicarbonate: 4.7 mmol/L — ABNORMAL LOW (ref 20.0–28.0)
FIO2: 21
O2 Saturation: 86 %
Patient temperature: 98.6
pCO2, Ven: <19 mmHg — CL (ref 44.0–60.0)
pH, Ven: 7.109 — CL (ref 7.250–7.430)
pO2, Ven: 68.2 mmHg — ABNORMAL HIGH (ref 32.0–45.0)

## 2020-11-06 LAB — LACTIC ACID, PLASMA: Lactic Acid, Venous: 1.5 mmol/L (ref 0.5–1.9)

## 2020-11-06 LAB — RAPID URINE DRUG SCREEN, HOSP PERFORMED
Amphetamines: NOT DETECTED
Barbiturates: NOT DETECTED
Benzodiazepines: NOT DETECTED
Cocaine: NOT DETECTED
Opiates: NOT DETECTED
Tetrahydrocannabinol: NOT DETECTED

## 2020-11-06 LAB — BETA-HYDROXYBUTYRIC ACID: Beta-Hydroxybutyric Acid: >8 mmol/L — ABNORMAL HIGH (ref 0.05–0.27)

## 2020-11-06 LAB — TROPONIN I (HIGH SENSITIVITY): Troponin I (High Sensitivity): 7 ng/L (ref <18)

## 2020-11-06 LAB — PROTIME-INR
INR: 1.1 (ref 0.8–1.2)
Prothrombin Time: 14.1 seconds (ref 11.4–15.2)

## 2020-11-06 LAB — LIPASE, BLOOD: Lipase: 23 U/L (ref 11–51)

## 2020-11-06 LAB — RESP PANEL BY RT-PCR (FLU A&B, COVID) ARPGX2
Influenza A by PCR: NEGATIVE
Influenza B by PCR: NEGATIVE
SARS Coronavirus 2 by RT PCR: NEGATIVE

## 2020-11-06 LAB — MRSA PCR SCREENING: MRSA by PCR: NEGATIVE

## 2020-11-06 LAB — MAGNESIUM: Magnesium: 2.6 mg/dL — ABNORMAL HIGH (ref 1.7–2.4)

## 2020-11-06 LAB — ETHANOL: Alcohol, Ethyl (B): <10 mg/dL (ref <10)

## 2020-11-06 LAB — PHOSPHORUS: Phosphorus: 4.7 mg/dL — ABNORMAL HIGH (ref 2.5–4.6)

## 2020-11-06 MED ORDER — INSULIN REGULAR(HUMAN) IN NACL 100-0.9 UT/100ML-% IV SOLN: INTRAVENOUS | Status: DC

## 2020-11-06 MED ORDER — DEXTROSE IN LACTATED RINGERS 5 % IV SOLN: INTRAVENOUS | Status: DC

## 2020-11-06 MED ORDER — LACTATED RINGERS IV BOLUS
1000.0000 mL | Freq: Once | INTRAVENOUS | Status: AC
  Administered 2020-11-06: 1000 mL via INTRAVENOUS

## 2020-11-06 MED ORDER — DEXTROSE 50 % IV SOLN: 0.0000 mL | INTRAVENOUS | Status: DC | PRN

## 2020-11-06 MED ORDER — SODIUM CHLORIDE 0.9 % IV BOLUS
1000.0000 mL | Freq: Once | INTRAVENOUS | Status: AC
  Administered 2020-11-06: 1000 mL via INTRAVENOUS

## 2020-11-06 MED ORDER — DEXTROSE 5 % IV SOLN: INTRAVENOUS | Status: DC

## 2020-11-06 MED ORDER — ACETAMINOPHEN 325 MG PO TABS: 650.0000 mg | ORAL_TABLET | Freq: Four times a day (QID) | ORAL | Status: DC | PRN

## 2020-11-06 MED ORDER — INSULIN REGULAR(HUMAN) IN NACL 100-0.9 UT/100ML-% IV SOLN
INTRAVENOUS | Status: DC
  Administered 2020-11-06: 3 [IU]/h via INTRAVENOUS

## 2020-11-06 MED ORDER — INSULIN REGULAR(HUMAN) IN NACL 100-0.9 UT/100ML-% IV SOLN
INTRAVENOUS | Status: DC
  Administered 2020-11-06: 7 [IU]/h via INTRAVENOUS
  Filled 2020-11-06: qty 100

## 2020-11-06 MED ORDER — CHLORHEXIDINE GLUCONATE CLOTH 2 % EX PADS
6.0000 | MEDICATED_PAD | Freq: Every day | CUTANEOUS | Status: DC
  Administered 2020-11-06 – 2020-11-08 (×2): 6 via TOPICAL

## 2020-11-06 MED ORDER — LACTATED RINGERS IV SOLN: INTRAVENOUS | Status: DC

## 2020-11-06 MED ORDER — POTASSIUM CHLORIDE 10 MEQ/100ML IV SOLN
10.0000 meq | INTRAVENOUS | Status: AC
  Administered 2020-11-06 (×3): 10 meq via INTRAVENOUS
  Filled 2020-11-06 (×3): qty 100

## 2020-11-06 MED ORDER — LACTATED RINGERS IV BOLUS: 20.0000 mL/kg | Freq: Once | INTRAVENOUS | Status: DC

## 2020-11-06 MED ORDER — SODIUM CHLORIDE 0.9 % IV SOLN: INTRAVENOUS | Status: DC

## 2020-11-06 MED ORDER — ENOXAPARIN SODIUM 40 MG/0.4ML ~~LOC~~ SOLN
40.0000 mg | SUBCUTANEOUS | Status: DC
  Administered 2020-11-06 – 2020-11-07 (×2): 40 mg via SUBCUTANEOUS
  Filled 2020-11-06 (×2): qty 0.4

## 2020-11-06 MED ORDER — ACETAMINOPHEN 650 MG RE SUPP: 650.0000 mg | Freq: Four times a day (QID) | RECTAL | Status: DC | PRN

## 2020-11-06 MED ORDER — PANTOPRAZOLE SODIUM 40 MG IV SOLR
40.0000 mg | Freq: Once | INTRAVENOUS | Status: AC
  Administered 2020-11-06: 40 mg via INTRAVENOUS
  Filled 2020-11-06: qty 40

## 2020-11-06 MED ORDER — DEXTROSE 50 % IV SOLN
0.0000 mL | INTRAVENOUS | Status: DC | PRN
Start: 2020-11-06 — End: 2020-11-08

## 2020-11-06 MED ORDER — POLYETHYLENE GLYCOL 3350 17 G PO PACK: 17.0000 g | PACK | Freq: Every day | ORAL | Status: DC | PRN

## 2020-11-06 NOTE — ED Triage Notes (Signed)
EMS reports from home, Type 1 diabetes. C/o weakness nausea and emesis X 3 days. Pt states compliant with insulin. Had similar episode a few months ago and states feels the same.  BP 124/78 HR 120 RR 16 Sp02 100 RA CBG 394 Temp 98.2  18ga L forearm. NS enroute

## 2020-11-06 NOTE — H&P (Addendum)
History and Physical    Craig Solis FAO:[id number] DOB: 04-Feb-1996 DOA: 11/06/2020  PCP: Patient, No Pcp Per (Inactive)  Patient coming from: home   Chief Complaint: home   HPI: Craig Solis is a 25 y.o. male with medical history significant of type 1 diabetes mellitus with a last hemoglobin A1c 13 with history of noncompliance comes into the hospital for nausea vomiting polyuria and polydipsia he relates he has not taking his insulin over 2 days.  He denies any cough, shortness of breath, diarrhea, fever, denies any chest pain or cuts or sores  ED Course:  Noted to be tachycardic with a blood pressure of 99/62 satting 100%.  Bicarbonate of less than 7 blood glucose of 500 creatinine of 1.4 (with a baseline of less than 1) and an anion gap greater than 20  Review of Systems: As per HPI otherwise all other systems reviewed and are negative.  Past Medical History:  Diagnosis Date  . Diabetes mellitus without complication ([place])     History reviewed. No pertinent surgical history.  Social History  reports that he has never smoked. He has never used smokeless tobacco. He reports current alcohol use. He reports previous drug use.  No Known Allergies  Family History  Problem Relation Age of Onset  . Hypersomnolence Mother   . Hypersomnolence Father   . Hypertension Neg Hx   . Diabetes Neg Hx      Prior to Admission medications   Medication Sig Start Date End Date Taking? Authorizing Provider  insulin aspart (FIASP FLEXTOUCH) 100 UNIT/ML FlexTouch Pen Inject 4-12 Units into the skin 3 (three) times daily before meals. Sliding scale per Patient : not provided   Yes [provider]  insulin degludec (TRESIBA FLEXTOUCH) 100 UNIT/ML FlexTouch Pen Inject 20 Units into the skin daily.   Yes [provider]    Physical Exam: Vitals:   11/06/20 0900 11/06/20 0937 11/06/20 1000 11/06/20 1030  BP: (!) 137/91 104/67 101/75 99/62  Pulse: (!) 118 (!) 113 (!) 116  (!) 112  Resp: _0 (!) 29  Temp:      TempSrc:      SpO2: 100% 100% 100% 100%    Constitutional: NAD, calm, comfortable, cachectic appearing Vitals:   11/06/20 0900 11/06/20 0937 11/06/20 1000 11/06/20 1030  BP: (!) 137/91 104/67 101/75 99/62  Pulse: (!) 118 (!) 113 (!) 116 (!) 112  Resp: _1 (!) 29  Temp:      TempSrc:      SpO2: 100% 100% 100% 100%   Eyes: PERRL, lids and conjunctivae normal ENMT: Mucous membranes are moist. Posterior pharynx clear of any exudate or lesions.Normal dentition.  Neck: normal, supple, no masses, no thyromegaly Respiratory: clear to auscultation bilaterally, no wheezing, no crackles. Normal respiratory effort. No accessory muscle use.  Cardiovascular: Regular rate and rhythm, no murmurs / rubs / gallops. No extremity edema. 2+ pedal pulses. No carotid bruits.  Abdomen: no tenderness, no masses palpated. No hepatosplenomegaly. Bowel sounds positive.  Musculoskeletal: no clubbing / cyanosis. No joint deformity upper and lower extremities. Good ROM, no contractures. Normal muscle tone.  Skin: Dried up scar on the dorsum of his left foot Neurologic: CN 2-12 grossly intact. Sensation intact, DTR normal. Strength 5/5 in all 4.  Psychiatric: Normal judgment and insight. Alert and oriented x 3. Normal mood.    Labs on Admission: I have personally reviewed following labs and imaging studies  CBC: Recent Labs  Lab 11/06/20 0905  WBC  21.5*  NEUTROABS 18.9*  HGB 13.9  HCT 45.7  MCV 86.2  PLT 468*    Basic Metabolic Panel: Recent Labs  Lab 11/06/20 0905  NA 125*  K 5.1  CL 92*  CO2 <7*  GLUCOSE 565*  BUN 38*  CREATININE 1.44*  CALCIUM 8.9  MG 2.6*  PHOS 4.7*    GFR: CrCl cannot be calculated (Unknown ideal weight.).  Liver Function Tests: Recent Labs  Lab 11/06/20 0905  AST 13*  ALT 19  ALKPHOS 115  BILITOT 2.0*  PROT 8.8*  ALBUMIN 4.3    Urine analysis:    Component Value Date/Time   COLORURINE STRAW (A)  11/06/2020 0848   APPEARANCEUR CLEAR 11/06/2020 0848   LABSPEC 1.021 11/06/2020 0848   PHURINE 5.0 11/06/2020 0848   GLUCOSEU >=500 (A) 11/06/2020 0848   HGBUR NEGATIVE 11/06/2020 0848   BILIRUBINUR NEGATIVE 11/06/2020 0848   KETONESUR 80 (A) 11/06/2020 0848   PROTEINUR 30 (A) 11/06/2020 0848   NITRITE NEGATIVE 11/06/2020 0848   LEUKOCYTESUR NEGATIVE 11/06/2020 0848    Radiological Exams on Admission: No results found.  EKG: Independently reviewed.  None  Assessment/Plan DKA, type 1 ([place]) Likely due to noncompliance with his medication, he has a blood glucose greater than 500 and anion gap greater than 20 positive ketones and beta hydroxybutyrate positive.  He denies any cough or shortness of breath his UA showed no signs of infection. Agree with IV insulin and aggressive IV fluid hydration. Continue IV insulin per protocol continue CBGs hourly. Monitor his potassium we will go down with correction of acidosis we will replete once less than 4.  Pseudo hyponatremia/hyponatremia: Mixed component, will check B-met every 4 hours his sodium corrected for his blood glucoses 132. Check 4 hour.  AKI (acute kidney injury) ([place]) Likely prerenal in the setting of hypovolemia due to DKA we will start him on aggressive IV fluid hydration recheck a basic metabolic panel.  Leukocytosis Likely stress demargination he remains afebrile denies any cough or fever or shortness of breath, the sore he has on the left dorsum of his foot is dried up.  He is UA showed no signs of infection. We will continue to monitor fever curve.    DVT prophylaxis: lovenox Code Status:   Full Family Communication:  None  Consults called:  None Admission status:  Inpatient  Severity of Illness: The appropriate patient status for this patient is INPATIENT. Inpatient status is judged to be reasonable and necessary in order to provide the required intensity of service to ensure the patient's safety. The patient's  presenting symptoms, physical exam findings, and initial radiographic and laboratory data in the context of their chronic comorbidities is felt to place them at high risk for further clinical deterioration. Furthermore, it is not anticipated that the patient will be medically stable for discharge from the hospital within 2 midnights of admission. The following factors support the patient status of inpatient.   25 year old with past medical history of type 1 diabetes mellitus with a history of noncompliance, nausea and vomiting with a blood glucose greater 500 anion gap greater than 20 and bicarb less than 10 sodium was 125, likely due to noncompliance start him on IV insulin drip and IV fluids check labs regularly.    I certify that at the point of admission it is my clinical judgment that the patient will require inpatient hospital care spanning beyond 2 midnights from the point of admission due to high intensity of service, high risk for further  deterioration and high frequency of surveillance required.Charlynne Cousins MD [facility]  How to contact the [facility] Attending or Consulting provider [place] or covering provider during after hours [place], for this patient?   1. Check the care team in [facility] and look for a) attending/consulting [facility] provider listed and b) the [facility] team listed 2. Log into [email or web address] and use [place]'s universal password to access. If you do not have the password, please contact the hospital operator. 3. Locate the [facility] provider you are looking for under [facility] and page to a number that you can be directly reached. 4. If you still have difficulty reaching the provider, please page the [facility] (Director on Call) for the Hospitalists listed on amion for assistance.  11/06/2020, 11:27 AM

## 2020-11-06 NOTE — ED Provider Notes (Signed)
[facility]-EMERGENCY DEPT Provider Note   CSN: [id number] Arrival date & time: 11/06/20  7510     History Chief Complaint  Patient presents with  . Hyperglycemia  . Nausea  . Emesis    Craig Solis is a 25 y.o. male.  HPI Patient has history of type 1 diabetes.  He reports he was compliant with his insulin until 2 days ago.  He began getting sick 4 days ago with generalized fatigue and nausea.  He started vomiting a lot 2 days ago.  He reports he is extremely fatigued now and feels unwell.  He is denying any focal areas of pain.  He is denying fever.  He is denying any new wounds or areas of skin infection or rash.  Patient reports last time he admitted for DKA was about 6 months ago.    Past Medical History:  Diagnosis Date  . Diabetes mellitus without complication [facility])     Patient Active Problem List   Diagnosis Date Noted  . Dehydration 12/08/2019  . DKA, type 1 (HCC) 12/08/2019  . Hypokalemia 12/08/2019  . DKA (diabetic ketoacidoses) 12/08/2019    History reviewed. No pertinent surgical history.     Family History  Problem Relation Age of Onset  . Hypertension Neg Hx   . Diabetes Neg Hx     Social History   Tobacco Use  . Smoking status: Never Smoker  . Smokeless tobacco: Never Used  Substance Use Topics  . Alcohol use: Yes  . Drug use: Not Currently    Home Medications Prior to Admission medications   Medication Sig Start Date End Date Taking? Authorizing Provider  insulin aspart (FIASP FLEXTOUCH) 100 UNIT/ML FlexTouch Pen Inject 4-12 Units into the skin 3 (three) times daily before meals. Sliding scale per Patient : not provided   Yes [provider]  insulin degludec (TRESIBA FLEXTOUCH) 100 UNIT/ML FlexTouch Pen Inject 20 Units into the skin daily.   Yes [provider]    Allergies    Patient has no known allergies.  Review of Systems   Review of Systems 10 systems reviewed and negative except as  per HPI Physical Exam Updated Vital Signs BP 99/62   Pulse (!) 112   Temp 97.9 F (36.6 C) (Oral)   Resp (!) 29   SpO2 100%   Physical Exam Constitutional:      Comments: Patient is ill and fatigued in appearance.  Very thin.  Ketotic smell.  HENT:     Head: Normocephalic and atraumatic.     Mouth/Throat:     Comments: Mucous memories slightly dry.  A fine vesicular lesions on the soft palate of the posterior oropharynx. Eyes:     Extraocular Movements: Extraocular movements intact.     Pupils: Pupils are equal, round, and reactive to light.  Cardiovascular:     Comments: Tachycardia, regular, 2 out of 6 systolic ejection murmur Pulmonary:     Effort: Pulmonary effort is normal.     Breath sounds: Normal breath sounds.  Abdominal:     General: There is no distension.     Palpations: Abdomen is soft.     Tenderness: There is no abdominal tenderness. There is no guarding.  Musculoskeletal:     Cervical back: Neck supple.     Comments: Legs are extremely thin.  No peripheral edema.  Very superficial wound on the dorsum of the foot with a dressing on it on the right.  This is a healing, superficial abrasion  appearance.  No signs of secondary infection.  No erythema surrounding the area of injury.   Skin:    General: Skin is warm and dry.  Neurological:     Comments: Patient appears very fatigued and ill.  He is answering questions slowly in a limited fashion.  He appears situationally oriented.  No focal motor deficits.     ED Results / Procedures / Treatments   Labs (all labs ordered are listed, but only abnormal results are displayed) Labs Reviewed  COMPREHENSIVE METABOLIC PANEL - Abnormal; Notable for the following components:      Result Value   Sodium 125 (*)    Chloride 92 (*)    CO2 <7 (*)    Glucose, Bld 565 (*)    BUN 38 (*)    Creatinine, Ser 1.44 (*)    Total Protein 8.8 (*)    AST 13 (*)    Total Bilirubin 2.0 (*)    All other components within normal  limits  CBC WITH DIFFERENTIAL/PLATELET - Abnormal; Notable for the following components:   WBC 21.5 (*)    Platelets 468 (*)    Neutro Abs 18.9 (*)    Monocytes Absolute 1.5 (*)    Abs Immature Granulocytes 0.11 (*)    All other components within normal limits  URINALYSIS, ROUTINE W REFLEX MICROSCOPIC - Abnormal; Notable for the following components:   Color, Urine STRAW (*)    Glucose, UA >=500 (*)    Ketones, ur 80 (*)    Protein, ur 30 (*)    Bacteria, UA RARE (*)    All other components within normal limits  BLOOD GAS, VENOUS - Abnormal; Notable for the following components:   pH, Ven 7.109 (*)    pCO2, Ven <19.0 (*)    pO2, Ven 68.2 (*)    Bicarbonate 4.7 (*)    Acid-base deficit 24.4 (*)    All other components within normal limits  MAGNESIUM - Abnormal; Notable for the following components:   Magnesium 2.6 (*)    All other components within normal limits  PHOSPHORUS - Abnormal; Notable for the following components:   Phosphorus 4.7 (*)    All other components within normal limits  BETA-HYDROXYBUTYRIC ACID - Abnormal; Notable for the following components:   Beta-Hydroxybutyric Acid >8.00 (*)    All other components within normal limits  CBG MONITORING, ED - Abnormal; Notable for the following components:   Glucose-Capillary 552 (*)    All other components within normal limits  CBG MONITORING, ED - Abnormal; Notable for the following components:   Glucose-Capillary 451 (*)    All other components within normal limits  RESP PANEL BY RT-PCR (FLU A&B, COVID) ARPGX2  ETHANOL  LIPASE, BLOOD  LACTIC ACID, PLASMA  PROTIME-INR  RAPID URINE DRUG SCREEN, HOSP PERFORMED  LACTIC ACID, PLASMA  I-STAT CHEM 8, ED  TROPONIN I (HIGH SENSITIVITY)  TROPONIN I (HIGH SENSITIVITY)    EKG None  Radiology No results found.  Procedures Procedures  CRITICAL CARE Performed by: Arby Barrette   Total critical care time: 33 minutes  Critical care time was exclusive of  separately billable procedures and treating other patients.  Critical care was necessary to treat or prevent imminent or life-threatening deterioration.  Critical care was time spent personally by me on the following activities: development of treatment plan with patient and/or surrogate as well as nursing, discussions with consultants, evaluation of patient's response to treatment, examination of patient, obtaining history from patient or surrogate, ordering  and performing treatments and interventions, ordering and review of laboratory studies, ordering and review of radiographic studies, pulse oximetry and re-evaluation of patient's condition. Medications Ordered in ED Medications  insulin regular, human (MYXREDLIN) 100 units/ 100 mL infusion (4 Units/hr Intravenous Rate/Dose Change 11/06/20 1047)  lactated ringers infusion ( Intravenous New Bag/Given 11/06/20 1014)  dextrose 5 % in lactated ringers infusion (has no administration in time range)  dextrose 50 % solution 0-50 mL (has no administration in time range)  pantoprazole (PROTONIX) injection 40 mg (has no administration in time range)  sodium chloride 0.9 % bolus 1,000 mL (0 mLs Intravenous Stopped 11/06/20 1014)  lactated ringers bolus 1,000 mL (1,000 mLs Intravenous New Bag/Given 11/06/20 1013)    ED Course  I have reviewed the triage vital signs and the nursing notes.  Pertinent labs & imaging results that were available during my care of the patient were reviewed by me and considered in my medical decision making (see chart for details).  Clinical Course as of 11/06/20 1108  Tue Nov 06, 2020  1108 Consult: [facility] hospitalist Dr. Radonna Ricker for admission [MP]    Clinical Course User Index [MP] Arby Barrette, MD   MDM Rules/Calculators/A&P                          Patient presents as outlined above.  He is a type I diabetic with DKA.  Patient's mental status is situationally oriented.  He is ill in appearance with tachypnea and  lethargy but oriented.  He can follow commands for completing physical exam.  Patient describes symptoms onset with nausea and vomiting and then not taking insulin for 2 days.  Review of systems does not suggest infectious trigger.  Patient did not have an idea what might of triggered this episode.  He has a very minor wound on his right foot.  This does not appear to be secondarily infected and does not have any surrounding cellulitis or swelling of the foot.  No other areas of erythema or rash.  Urinalysis negative.  Chest x-ray pending but review of systems is not suggestive of pneumonia.  Rehydration and insulin initiated.  Patient will be admitted for DKA. Final Clinical Impression(s) / ED Diagnoses Final diagnoses:  Type 1 diabetes mellitus with ketoacidosis without coma [facility])    Rx / DC Orders ED Discharge Orders    None       Arby Barrette, MD 11/06/20 1112

## 2020-11-07 LAB — GLUCOSE, CAPILLARY
Glucose-Capillary: 132 mg/dL — ABNORMAL HIGH (ref 70–99)
Glucose-Capillary: 150 mg/dL — ABNORMAL HIGH (ref 70–99)
Glucose-Capillary: 167 mg/dL — ABNORMAL HIGH (ref 70–99)
Glucose-Capillary: 171 mg/dL — ABNORMAL HIGH (ref 70–99)
Glucose-Capillary: 173 mg/dL — ABNORMAL HIGH (ref 70–99)
Glucose-Capillary: 175 mg/dL — ABNORMAL HIGH (ref 70–99)

## 2020-11-07 LAB — BASIC METABOLIC PANEL
Anion gap: 12 (ref 5–15)
Anion gap: 13 (ref 5–15)
Anion gap: 9 (ref 5–15)
BUN: 16 mg/dL (ref 6–20)
BUN: 18 mg/dL (ref 6–20)
BUN: 18 mg/dL (ref 6–20)
CO2: 18 mmol/L — ABNORMAL LOW (ref 22–32)
CO2: 19 mmol/L — ABNORMAL LOW (ref 22–32)
CO2: 20 mmol/L — ABNORMAL LOW (ref 22–32)
Calcium: 8.9 mg/dL (ref 8.9–10.3)
Calcium: 9.2 mg/dL (ref 8.9–10.3)
Calcium: 9.4 mg/dL (ref 8.9–10.3)
Chloride: 101 mmol/L (ref 98–111)
Chloride: 103 mmol/L (ref 98–111)
Chloride: 106 mmol/L (ref 98–111)
Creatinine, Ser: 0.48 mg/dL — ABNORMAL LOW (ref 0.61–1.24)
Creatinine, Ser: 0.6 mg/dL — ABNORMAL LOW (ref 0.61–1.24)
Creatinine, Ser: 0.71 mg/dL (ref 0.61–1.24)
GFR, Estimated: >60 mL/min (ref >60)
GFR, Estimated: >60 mL/min (ref >60)
GFR, Estimated: >60 mL/min (ref >60)
Glucose, Bld: 156 mg/dL — ABNORMAL HIGH (ref 70–99)
Glucose, Bld: 158 mg/dL — ABNORMAL HIGH (ref 70–99)
Glucose, Bld: 168 mg/dL — ABNORMAL HIGH (ref 70–99)
Potassium: 3.6 mmol/L (ref 3.5–5.1)
Potassium: 3.9 mmol/L (ref 3.5–5.1)
Potassium: 4 mmol/L (ref 3.5–5.1)
Sodium: 131 mmol/L — ABNORMAL LOW (ref 135–145)
Sodium: 134 mmol/L — ABNORMAL LOW (ref 135–145)
Sodium: 136 mmol/L (ref 135–145)

## 2020-11-07 LAB — CBC
HCT: 38.7 % — ABNORMAL LOW (ref 39.0–52.0)
Hemoglobin: 12.5 g/dL — ABNORMAL LOW (ref 13.0–17.0)
MCH: 26.2 pg (ref 26.0–34.0)
MCHC: 32.3 g/dL (ref 30.0–36.0)
MCV: 81 fL (ref 80.0–100.0)
Platelets: 384 10*3/uL (ref 150–400)
RBC: 4.78 MIL/uL (ref 4.22–5.81)
RDW: 14.3 % (ref 11.5–15.5)
WBC: 16 10*3/uL — ABNORMAL HIGH (ref 4.0–10.5)
nRBC: 0 % (ref 0.0–0.2)

## 2020-11-07 LAB — HEMOGLOBIN A1C
Hgb A1c MFr Bld: 11.9 % — ABNORMAL HIGH (ref 4.8–5.6)
Mean Plasma Glucose: 294.83 mg/dL

## 2020-11-07 LAB — GROUP A STREP BY PCR: Group A Strep by PCR: NOT DETECTED

## 2020-11-07 MED ORDER — INSULIN ASPART 100 UNIT/ML ~~LOC~~ SOLN: 0.0000 [IU] | Freq: Every day | SUBCUTANEOUS | Status: DC

## 2020-11-07 MED ORDER — INSULIN ASPART 100 UNIT/ML ~~LOC~~ SOLN
0.0000 [IU] | Freq: Three times a day (TID) | SUBCUTANEOUS | Status: DC
  Administered 2020-11-07: 3 [IU] via SUBCUTANEOUS
  Administered 2020-11-07: 2 [IU] via SUBCUTANEOUS

## 2020-11-07 MED ORDER — INSULIN ASPART 100 UNIT/ML ~~LOC~~ SOLN: 4.0000 [IU] | Freq: Three times a day (TID) | SUBCUTANEOUS | Status: DC

## 2020-11-07 MED ORDER — INSULIN GLARGINE 100 UNIT/ML ~~LOC~~ SOLN
20.0000 [IU] | Freq: Every day | SUBCUTANEOUS | Status: DC
  Administered 2020-11-07: 20 [IU] via SUBCUTANEOUS
  Filled 2020-11-07: qty 0.2

## 2020-11-07 MED ORDER — PHENOL 1.4 % MT LIQD
1.0000 | OROMUCOSAL | Status: DC | PRN
  Administered 2020-11-08: 1 via OROMUCOSAL
  Filled 2020-11-07: qty 177

## 2020-11-07 MED ORDER — INSULIN ASPART 100 UNIT/ML ~~LOC~~ SOLN
0.0000 [IU] | Freq: Three times a day (TID) | SUBCUTANEOUS | Status: DC
  Administered 2020-11-07: 2 [IU] via SUBCUTANEOUS

## 2020-11-07 MED ORDER — INSULIN GLARGINE 100 UNIT/ML ~~LOC~~ SOLN
15.0000 [IU] | Freq: Two times a day (BID) | SUBCUTANEOUS | Status: DC
  Administered 2020-11-07 – 2020-11-08 (×3): 15 [IU] via SUBCUTANEOUS
  Filled 2020-11-07 (×3): qty 0.15

## 2020-11-07 NOTE — Progress Notes (Signed)
[facility] PROGRESS NOTE    Progress Note  Craig Solis  XBW:[id number] DOB: [birth year] DOA: 11/06/2020 PCP: Patient, No Pcp Per (Inactive)     Brief Narrative:   Craig Solis is an 25 y.o. male past medical history significant for type 1 diabetes mellitus uncontrolled with an A1c of 13 history of noncompliance comes into the hospital for nausea, vomiting, polyuria and polydipsia with not taking his insulin over 2 days.   Assessment/Plan:   DKA, type 1 (HCC)/metabolic acidosis: Started on IV insulin and IV fluids her anion gap is closed, his bicarb is 18, he has been changed to long-acting insulin plus sliding scale. Repeated A1c on this admission is 11.9 Blood glucose fairly well controlled, transfer to MedSurg. Check a basic metabolic panel.  Pseudohyponatremia: Has resolved with IV fluid hydration.  AKI (acute kidney injury) (HCC) Acute Kidney injury, likely prerenal in the setting of DKA resolved with IV fluid hydration.  Leukocytosis Has remained afebrile, leukocytosis improving likely stress margination.  DVT prophylaxis: lovenxo Family Communication:none Status is: Inpatient  Remains inpatient appropriate because:Hemodynamically unstable, transfer to MedSurg bed.   Dispo: The patient is from: Home              Anticipated d/c is to: Home              Patient currently is medically stable to d/c.   Difficult to place patient No   Code Status:     Code Status Orders  (From admission, onward)         Start     Ordered   11/06/20 1605  Full code  Continuous        11/06/20 1605        Code Status History    Date Active Date Inactive Code Status Order ID Comments User Context   12/09/2019 0454 12/09/2019 2115 Full Code [id number]  Therisa Doyne, MD ED   Advance Care Planning Activity        IV Access:    Peripheral IV   Procedures and diagnostic studies:   DG Chest Port 1 View  Result Date: 11/06/2020 CLINICAL DATA:   Hyperglycemia EXAM: PORTABLE CHEST 1 VIEW COMPARISON:  Dec 08, 2019 FINDINGS: The lungs are clear. Heart size and pulmonary vascularity are normal. No adenopathy. No bone lesions. IMPRESSION: Lungs clear.  Cardiac silhouette normal. Electronically Signed   By: Bretta Bang III M.D.   On: 11/06/2020 12:12     Medical Consultants:    None.   Subjective:    Craig Solis he relates he feels better than yesterday having a sore throat with no appetite.  Objective:    Vitals:   11/07/20 0500 11/07/20 0700 11/07/20 0757 11/07/20 0800  BP: 106/66 113/76  114/75  Pulse: (!) 113 (!) 117  (!) 117  Resp: 16 14  14   Temp:   98.6 F (37 C)   TempSrc:   Oral   SpO2: 98% 99%  99%  Weight:      Height:       SpO2: 99 %   Intake/Output Summary (Last 24 hours) at 11/07/2020 0817 Last data filed at 11/07/2020 0600 Gross per 24 hour  Intake 1572.37 ml  Output 1050 ml  Net 522.37 ml   Filed Weights   11/06/20 1630  Weight: 54.2 kg    Exam: General exam: In no acute distress, cachectic. Respiratory system: Good air movement and clear to auscultation. Cardiovascular system: S1 & S2 heard, RRR. No JVD. Gastrointestinal system:  Abdomen is nondistended, soft and nontender.  Extremities: No pedal edema. Skin: No rashes, lesions or ulcers  Data Reviewed:    Labs: Basic Metabolic Panel: Recent Labs  Lab 11/06/20 0905 11/06/20 1450 11/07/20 0000 11/07/20 0134  NA 125* 136 131* 136  K 5.1 3.7 3.9 4.0  CL 92* 111 103 106  CO2 <7* 11* 19* 18*  GLUCOSE 565* 210* 168* 158*  BUN 38* 21* 18 18  CREATININE 1.44* 0.75 0.71 0.48*  CALCIUM 8.9 7.9* 8.9 9.2  MG 2.6*  --   --   --   PHOS 4.7*  --   --   --    GFR Estimated Creatinine Clearance: 107.3 mL/min (A) (by C-G formula based on SCr of 0.48 mg/dL (L)). Liver Function Tests: Recent Labs  Lab 11/06/20 0905  AST 13*  ALT 19  ALKPHOS 115  BILITOT 2.0*  PROT 8.8*  ALBUMIN 4.3   Recent Labs  Lab 11/06/20 0905   LIPASE 23   No results for input(s): AMMONIA in the last 168 hours. Coagulation profile Recent Labs  Lab 11/06/20 0905  INR 1.1   COVID-19 Labs  No results for input(s): DDIMER, FERRITIN, LDH, CRP in the last 72 hours.  Lab Results  Component Value Date   SARSCOV2NAA NEGATIVE 11/06/2020   SARSCOV2NAA NEGATIVE 12/08/2019    CBC: Recent Labs  Lab 11/06/20 0905 11/07/20 0134  WBC 21.5* 16.0*  NEUTROABS 18.9*  --   HGB 13.9 12.5*  HCT 45.7 38.7*  MCV 86.2 81.0  PLT 468* 384   Cardiac Enzymes: No results for input(s): CKTOTAL, CKMB, CKMBINDEX, TROPONINI in the last 168 hours. BNP (last 3 results) No results for input(s): PROBNP in the last 8760 hours. CBG: Recent Labs  Lab 11/06/20 2225 11/06/20 2324 11/07/20 0031 11/07/20 0215 11/07/20 0752  GLUCAP 152* 166* 171* 167* 173*   D-Dimer: No results for input(s): DDIMER in the last 72 hours. Hgb A1c: Recent Labs    11/07/20 0134  HGBA1C 11.9*   Lipid Profile: No results for input(s): CHOL, HDL, LDLCALC, TRIG, CHOLHDL, LDLDIRECT in the last 72 hours. Thyroid function studies: No results for input(s): TSH, T4TOTAL, T3FREE, THYROIDAB in the last 72 hours.  Invalid input(s): FREET3 Anemia work up: No results for input(s): VITAMINB12, FOLATE, FERRITIN, TIBC, IRON, RETICCTPCT in the last 72 hours. Sepsis Labs: Recent Labs  Lab 11/06/20 0905 11/07/20 0134  WBC 21.5* 16.0*  LATICACIDVEN 1.5  --    Microbiology Recent Results (from the past 240 hour(s))  Resp Panel by RT-PCR (Flu A&B, Covid) Nasopharyngeal Swab     Status: None   Collection Time: 11/06/20 11:50 AM   Specimen: Nasopharyngeal Swab; Nasopharyngeal(NP) swabs in vial transport medium  Result Value Ref Range Status   SARS Coronavirus 2 by RT PCR NEGATIVE NEGATIVE Final    Comment: (NOTE) SARS-CoV-2 target nucleic acids are NOT DETECTED.  The SARS-CoV-2 RNA is generally detectable in upper respiratory specimens during the acute phase of  infection. The lowest concentration of SARS-CoV-2 viral copies this assay can detect is 138 copies/mL. A negative result does not preclude SARS-Cov-2 infection and should not be used as the sole basis for treatment or other patient management decisions. A negative result may occur with  improper specimen collection/handling, submission of specimen other than nasopharyngeal swab, presence of viral mutation(s) within the areas targeted by this assay, and inadequate number of viral copies(<138 copies/mL). A negative result must be combined with clinical observations, patient history, and epidemiological information. The expected  result is Negative.  Fact Sheet for Patients:  BloggerCourse.com  Fact Sheet for Healthcare Providers:  SeriousBroker.it  This test is no t yet approved or cleared by the [place] FDA and  has been authorized for detection and/or diagnosis of SARS-CoV-2 by FDA under an Emergency Use Authorization (EUA). This EUA will remain  in effect (meaning this test can be used) for the duration of the COVID-19 declaration under Section 564(b)(1) of the Act, 21 U.S.C.section 360bbb-3(b)(1), unless the authorization is terminated  or revoked sooner.       Influenza A by PCR NEGATIVE NEGATIVE Final   Influenza B by PCR NEGATIVE NEGATIVE Final    Comment: (NOTE) The Xpert Xpress SARS-CoV-2/FLU/RSV plus assay is intended as an aid in the diagnosis of influenza from Nasopharyngeal swab specimens and should not be used as a sole basis for treatment. Nasal washings and aspirates are unacceptable for Xpert Xpress SARS-CoV-2/FLU/RSV testing.  Fact Sheet for Patients: BloggerCourse.com  Fact Sheet for Healthcare Providers: SeriousBroker.it  This test is not yet approved or cleared by the [place] FDA and has been authorized for detection and/or diagnosis of SARS-CoV-2  by FDA under an Emergency Use Authorization (EUA). This EUA will remain in effect (meaning this test can be used) for the duration of the COVID-19 declaration under Section 564(b)(1) of the Act, 21 U.S.C. section 360bbb-3(b)(1), unless the authorization is terminated or revoked.  Performed at [facility], [address], [place], [place] [address]   MRSA PCR Screening     Status: None   Collection Time: 11/06/20  4:08 PM   Specimen: Nasal Mucosa; Nasopharyngeal  Result Value Ref Range Status   MRSA by PCR NEGATIVE NEGATIVE Final    Comment:        The GeneXpert MRSA Assay (FDA approved for NASAL specimens only), is one component of a comprehensive MRSA colonization surveillance program. It is not intended to diagnose MRSA infection nor to guide or monitor treatment for MRSA infections. Performed at [facility], [address] [place], [place] [address]      Medications:   . Chlorhexidine Gluconate Cloth  6 each Topical Daily  . enoxaparin (LOVENOX) injection  40 mg Subcutaneous Q24H  . insulin aspart  0-9 Units Subcutaneous TID WC  . insulin glargine  20 Units Subcutaneous QHS   Continuous Infusions: . dextrose Stopped (11/07/20 0259)  . insulin Stopped (11/07/20 0258)      LOS: 1 day   Marinda Elk  [facility]  11/07/2020, 8:17 AM

## 2020-11-07 NOTE — Progress Notes (Incomplete)
Inpatient Diabetes Program Recommendations  AACE/ADA: New Consensus Statement on Inpatient Glycemic Control (2015)  Target Ranges:  Prepandial:   less than 140 mg/dL      Peak postprandial:   less than 180 mg/dL (1-2 hours)      Critically ill patients:  140 - 180 mg/dL   Lab Results  Component Value Date   GLUCAP 132 (H) 11/07/2020   HGBA1C 11.9 (H) 11/07/2020    Review of Glycemic Control  Diabetes history: DM1 Outpatient Diabetes medications: Tresiba 20 units QD, Fiasp 4-12 units TID with meals Current orders for Inpatient glycemic control: Lantus 15 units BID, Novolog 0-15 units TID with meals and 0-5 HS + 4 units TID with meals  HgbA1C - 11.9% CBGs 173, 175, 150, 132 mg/dL 7 units of Novolog correction for 4/27  Inpatient Diabetes Program Recommendations:     Decrease Novolog to 0-9 units TID with meals and 0-5 HS (Type 1 and sensitive to insulin) If FBS < 80 mg/dL, decrease Lantus to 12 units BID  Spoke with pt at bedside and asked if he had run out of insulin. Pt states he had not. Did not take insulin because he "was too weak." Mumbled during entire conversation and said his throat hurt and he couldn't talk.  5 ED/Hosp Admissions in past 4.5 months. Endocrinologist is in [place] (where pt resides)

## 2020-11-07 NOTE — Progress Notes (Signed)
Inpatient Diabetes Program Recommendations  AACE/ADA: New Consensus Statement on Inpatient Glycemic Control (2015)  Target Ranges:  Prepandial:   less than 140 mg/dL      Peak postprandial:   less than 180 mg/dL (1-2 hours)      Critically ill patients:  140 - 180 mg/dL   Lab Results  Component Value Date   GLUCAP 132 (H) 11/07/2020   HGBA1C 11.9 (H) 11/07/2020    Review of Glycemic Control  Diabetes history: DM1 Outpatient Diabetes medications: Tresiba 20 units QD, Fiasp 4-12 units TID with meals Current orders for Inpatient glycemic control: Lantus 15 units BID, Novolog 0-15 units TID with meals and 0-5 HS + 4 units TID with meals  HgbA1C - 11.9% CBGs 173, 175, 150, 132 mg/dL 7 units of Novolog correction for 4/27  Inpatient Diabetes Program Recommendations:     Decrease Novolog to 0-9 units TID with meals and 0-5 HS (Type 1 and sensitive to insulin) If FBS < 80 mg/dL, decrease Lantus to 12 units BID  Spoke with pt at bedside and asked if he had run out of insulin. Pt states he had not. Did not take insulin because he "was too weak." Mumbled during entire conversation and said his throat hurt and he couldn't talk.  5 ED/Hosp Admissions in past 4.5 months. Endocrinologist is in [place] (where pt resides)  Discussed above with RN. ? Whether psych consult would be appropriate.  Follow closely.  Thank you. Ailene Ards, RD, LDN, CDE Inpatient Diabetes Coordinator [phone]

## 2020-11-07 NOTE — TOC Initial Note (Addendum)
Transition of Care [facility]) - Initial/Assessment Note    Patient Details  Name: Craig Solis MRN: [id number] Date of Birth: [birth year]  Transition of [facility] CM/SW Contact:    Golda Acre, RN Phone Number: 11/07/2020, 7:25 AM  Clinical Narrative:                 25 y.o. male with medical history significant of type 1 diabetes mellitus with a last hemoglobin A1c 13 with history of noncompliance comes into the hospital for nausea vomiting polyuria and polydipsia he relates he has not taking his insulin over 2 days.  He denies any cough, shortness of breath, diarrhea, fever, denies any chest pain or cuts or sores  ED Course:  Noted to be tachycardic with a blood pressure of 99/62 satting 100%.  Bicarbonate of less than 7 blood glucose of 500 creatinine of 1.4 (with a baseline of less than 1) and an anion gap greater than 20  Review of Systems: As per HPI otherwise all other systems reviewed and are negative. [id number]-Hgba1c=11.9, bld glucose 158, wbc-16.0, iv insulin and iv flds. PLAN: following for toc needs, will need to be seen by the diabetic coordinator. Expected Discharge Plan: Home/Self Care Barriers to Discharge: Continued Medical Work up   Patient Goals and CMS Choice Patient states their goals for this hospitalization and ongoing recovery are:: i want to go to [email or web address].gov Compare Post Acute Care list provided to:: Patient Choice offered to / list presented to : Patient  Expected Discharge Plan and Services Expected Discharge Plan: Home/Self Care   Discharge Planning Services: CM Consult   Living arrangements for the past 2 months: Single Family Home                                      Prior Living Arrangements/Services Living arrangements for the past 2 months: Single Family Home Lives with:: Self Patient language and need for interpreter reviewed:: Yes Do you feel safe going back to the place where you live?: Yes      Need for  Family Participation in Patient Care: No (Comment) Care giver support system in place?: No (comment)   Criminal Activity/Legal Involvement Pertinent to Current Situation/Hospitalization: No - Comment as needed  Activities of Daily Living Home Assistive Devices/Equipment: CBG Meter,Eyeglasses ADL Screening (condition at time of admission) Patient's cognitive ability adequate to safely complete daily activities?: No (patient very weak and sleepy) Is the patient deaf or have difficulty hearing?: No Does the patient have difficulty seeing, even when wearing glasses/contacts?: No Does the patient have difficulty concentrating, remembering, or making decisions?: Yes Patient able to express need for assistance with ADLs?: Yes Does the patient have difficulty dressing or bathing?: Yes Independently performs ADLs?: No Communication: Independent Dressing (OT): Needs assistance Is this a change from baseline?: Change from baseline, expected to last <3days Grooming: Needs assistance Is this a change from baseline?: Change from baseline, expected to last <3 days Feeding: Needs assistance Is this a change from baseline?: Change from baseline, expected to last <3 days Bathing: Needs assistance Is this a change from baseline?: Change from baseline, expected to last <3 days Toileting: Needs assistance Is this a change from baseline?: Change from baseline, expected to last <3 days In/Out Bed: Needs assistance Is this a change from baseline?: Change from baseline, expected to last <3 days Walks in Home: Needs assistance Is this a change from  baseline?: Change from baseline, expected to last <3 days Does the patient have difficulty walking or climbing stairs?: Yes (secondary to weakness) Weakness of Legs: Both Weakness of Arms/Hands: Both  Permission Sought/Granted                  Emotional Assessment Appearance:: Appears stated age Attitude/Demeanor/Rapport: Engaged Affect (typically  observed): Calm Orientation: : Oriented to Self,Oriented to  Time,Oriented to Place,Oriented to Situation Alcohol / Substance Use: Not Applicable Psych Involvement: No (comment)  Admission diagnosis:  DKA (diabetic ketoacidosis) (HCC) [E11.10] Type 1 diabetes mellitus with ketoacidosis without coma (HCC) [E10.10] Patient Active Problem List   Diagnosis Date Noted  . DKA (diabetic ketoacidosis) (HCC) 11/06/2020  . AKI (acute kidney injury) (HCC) 11/06/2020  . Hyponatremia 11/06/2020  . Leukocytosis 11/06/2020  . Dehydration 12/08/2019  . DKA, type 1 (HCC) 12/08/2019  . Hypokalemia 12/08/2019  . DKA (diabetic ketoacidoses) 12/08/2019   PCP:  Patient, No Pcp Per (Inactive) Pharmacy:   [facility] [address], [place] - [address] [address] Phone: [phone] Fax: [phone]     Social Determinants of Health (SDOH) Interventions    Readmission Risk Interventions No flowsheet data found.

## 2020-11-08 LAB — GLUCOSE, CAPILLARY
Glucose-Capillary: 114 mg/dL — ABNORMAL HIGH (ref 70–99)
Glucose-Capillary: 116 mg/dL — ABNORMAL HIGH (ref 70–99)

## 2020-11-08 MED ORDER — TRESIBA FLEXTOUCH 100 UNIT/ML ~~LOC~~ SOPN: 20.0000 [IU] | PEN_INJECTOR | Freq: Every day | SUBCUTANEOUS | 0 refills | Status: DC

## 2020-11-08 MED ORDER — FIASP FLEXTOUCH 100 UNIT/ML ~~LOC~~ SOPN: 4.0000 [IU] | PEN_INJECTOR | Freq: Three times a day (TID) | SUBCUTANEOUS | 0 refills | Status: AC

## 2020-11-08 NOTE — Discharge Summary (Signed)
Physician Discharge Summary  Kairon Shock TIR:[id number] DOB: February 16, 1996 DOA: 11/06/2020  PCP: Patient, No Pcp Per (Inactive)  Admit date: 11/06/2020 Discharge date: 11/08/2020  Admitted From: Home Disposition:  Home  Recommendations for Outpatient Follow-up:  1. Follow up with PCP in 1-2 weeks 2. Please obtain BMP/CBC in one week   Home Health:No Equipment/Devices:None  Discharge Condition:Stable CODE STATUS:Full Diet recommendation: Heart Healthy   Brief/Interim Summary: 25 y.o. male past medical history significant for type 1 diabetes mellitus uncontrolled with an A1c of 13 history of noncompliance comes into the hospital for nausea, vomiting, polyuria and polydipsia with not taking his insulin over 2 days.  Discharge Diagnoses:  Active Problems:   DKA, type 1 (HCC)   AKI (acute kidney injury) (HCC)   Hyponatremia   Leukocytosis  DKA/diabetes mellitus type 1: Likely due to noncompliance he was started on IV insulin IV fluids his gap closed bicarb became greater than 20 we will change to long-acting insulin plus sliding scale.  A1c showed hemoglobin A1c of 11.9. His blood glucose remained fairly controlled throughout his hospital stay he was given prescription for his medication he will follow-up with his endocrinologist in [place] as an outpatient.  Pseudohyponatremia: Resolved with IV fluid hydration.  Acute kidney injury: Likely prerenal azotemia resolved with IV fluid hydration.  Leukocytosis he remained afebrile likely stress margination and started trending down.  Discharge Instructions  Discharge Instructions    Diet - low sodium heart healthy   Complete by: As directed    Increase activity slowly   Complete by: As directed    No wound care   Complete by: As directed      Allergies as of 11/08/2020   No Known Allergies     Medication List    TAKE these medications   Fiasp FlexTouch 100 UNIT/ML FlexTouch Pen Generic drug: insulin aspart Inject  4-12 Units into the skin 3 (three) times daily before meals. Sliding scale per Patient : not provided   Evaristo Bury FlexTouch 100 UNIT/ML FlexTouch Pen Generic drug: insulin degludec Inject 20 Units into the skin daily.       No Known Allergies  Consultations:  None   Procedures/Studies: DG Chest Port 1 View  Result Date: 11/06/2020 CLINICAL DATA:  Hyperglycemia EXAM: PORTABLE CHEST 1 VIEW COMPARISON:  Dec 08, 2019 FINDINGS: The lungs are clear. Heart size and pulmonary vascularity are normal. No adenopathy. No bone lesions. IMPRESSION: Lungs clear.  Cardiac silhouette normal. Electronically Signed   By: Bretta Bang III M.D.   On: 11/06/2020 12:12    Subjective: No new complaints.  Discharge Exam: Vitals:   11/08/20 0815 11/08/20 0938  BP: 112/79 104/80  Pulse: (!) 118 (!) 107  Resp: 20 16  Temp: 98.8 F (37.1 C) 98.4 F (36.9 C)  SpO2: 100% 97%   Vitals:   11/08/20 0141 11/08/20 0621 11/08/20 0815 11/08/20 0938  BP: 106/78 107/83 112/79 104/80  Pulse: (!) 110 (!) 110 (!) 118 (!) 107  Resp: 16 20 20 16   Temp: 98.5 F (36.9 C) 98.6 F (37 C) 98.8 F (37.1 C) 98.4 F (36.9 C)  TempSrc: Oral Oral Oral Oral  SpO2: 100% 100% 100% 97%  Weight:      Height:        General: Pt is alert, awake, not in acute distress Cardiovascular: RRR, S1/S2 +, no rubs, no gallops Respiratory: CTA bilaterally, no wheezing, no rhonchi Abdominal: Soft, NT, ND, bowel sounds + Extremities: no edema, no cyanosis    The  results of significant diagnostics from this hospitalization (including imaging, microbiology, ancillary and laboratory) are listed below for reference.     Microbiology: Recent Results (from the past 240 hour(s))  Resp Panel by RT-PCR (Flu A&B, Covid) Nasopharyngeal Swab     Status: None   Collection Time: 11/06/20 11:50 AM   Specimen: Nasopharyngeal Swab; Nasopharyngeal(NP) swabs in vial transport medium  Result Value Ref Range Status   SARS Coronavirus 2 by  RT PCR NEGATIVE NEGATIVE Final    Comment: (NOTE) SARS-CoV-2 target nucleic acids are NOT DETECTED.  The SARS-CoV-2 RNA is generally detectable in upper respiratory specimens during the acute phase of infection. The lowest concentration of SARS-CoV-2 viral copies this assay can detect is 138 copies/mL. A negative result does not preclude SARS-Cov-2 infection and should not be used as the sole basis for treatment or other patient management decisions. A negative result may occur with  improper specimen collection/handling, submission of specimen other than nasopharyngeal swab, presence of viral mutation(s) within the areas targeted by this assay, and inadequate number of viral copies(<138 copies/mL). A negative result must be combined with clinical observations, patient history, and epidemiological information. The expected result is Negative.  Fact Sheet for Patients:  BloggerCourse.com  Fact Sheet for Healthcare Providers:  SeriousBroker.it  This test is no t yet approved or cleared by the [place] FDA and  has been authorized for detection and/or diagnosis of SARS-CoV-2 by FDA under an Emergency Use Authorization (EUA). This EUA will remain  in effect (meaning this test can be used) for the duration of the COVID-19 declaration under Section 564(b)(1) of the Act, 21 U.S.C.section 360bbb-3(b)(1), unless the authorization is terminated  or revoked sooner.       Influenza A by PCR NEGATIVE NEGATIVE Final   Influenza B by PCR NEGATIVE NEGATIVE Final    Comment: (NOTE) The Xpert Xpress SARS-CoV-2/FLU/RSV plus assay is intended as an aid in the diagnosis of influenza from Nasopharyngeal swab specimens and should not be used as a sole basis for treatment. Nasal washings and aspirates are unacceptable for Xpert Xpress SARS-CoV-2/FLU/RSV testing.  Fact Sheet for Patients: BloggerCourse.com  Fact Sheet  for Healthcare Providers: SeriousBroker.it  This test is not yet approved or cleared by the [place] FDA and has been authorized for detection and/or diagnosis of SARS-CoV-2 by FDA under an Emergency Use Authorization (EUA). This EUA will remain in effect (meaning this test can be used) for the duration of the COVID-19 declaration under Section 564(b)(1) of the Act, 21 U.S.C. section 360bbb-3(b)(1), unless the authorization is terminated or revoked.  Performed at [facility], [address] [place], [place] [address]   MRSA PCR Screening     Status: None   Collection Time: 11/06/20  4:08 PM   Specimen: Nasal Mucosa; Nasopharyngeal  Result Value Ref Range Status   MRSA by PCR NEGATIVE NEGATIVE Final    Comment:        The GeneXpert MRSA Assay (FDA approved for NASAL specimens only), is one component of a comprehensive MRSA colonization surveillance program. It is not intended to diagnose MRSA infection nor to guide or monitor treatment for MRSA infections. Performed at [facility], [address], [place], [place] [address]   Group A Strep by PCR     Status: None   Collection Time: 11/07/20  8:57 AM   Specimen: Throat; Sterile Swab  Result Value Ref Range Status   Group A Strep by PCR NOT DETECTED NOT DETECTED Final  Comment: Performed at [facility], [address] [place], [place] [address]     Labs: BNP (last 3 results) No results for input(s): BNP in the last 8760 hours. Basic Metabolic Panel: Recent Labs  Lab 11/06/20 0905 11/06/20 1450 11/07/20 0000 11/07/20 0134 11/07/20 0936  NA 125* 136 131* 136 134*  K 5.1 3.7 3.9 4.0 3.6  CL 92* 111 103 106 101  CO2 <7* 11* 19* 18* 20*  GLUCOSE 565* 210* 168* 158* 156*  BUN 38* 21* 18 18 16   CREATININE 1.44* 0.75 0.71 0.48* 0.60*  CALCIUM 8.9 7.9* 8.9 9.2 9.4  MG 2.6*  --   --   --   --   PHOS 4.7*  --   --   --   --     Liver Function Tests: Recent Labs  Lab 11/06/20 0905  AST 13*  ALT 19  ALKPHOS 115  BILITOT 2.0*  PROT 8.8*  ALBUMIN 4.3   Recent Labs  Lab 11/06/20 0905  LIPASE 23   No results for input(s): AMMONIA in the last 168 hours. CBC: Recent Labs  Lab 11/06/20 0905 11/07/20 0134  WBC 21.5* 16.0*  NEUTROABS 18.9*  --   HGB 13.9 12.5*  HCT 45.7 38.7*  MCV 86.2 81.0  PLT 468* 384   Cardiac Enzymes: No results for input(s): CKTOTAL, CKMB, CKMBINDEX, TROPONINI in the last 168 hours. BNP: Invalid input(s): POCBNP CBG: Recent Labs  Lab 11/07/20 0752 11/07/20 1141 11/07/20 1741 11/07/20 2142 11/08/20 0721  GLUCAP 173* 175* 150* 132* 114*   D-Dimer No results for input(s): DDIMER in the last 72 hours. Hgb A1c Recent Labs    11/07/20 0134  HGBA1C 11.9*   Lipid Profile No results for input(s): CHOL, HDL, LDLCALC, TRIG, CHOLHDL, LDLDIRECT in the last 72 hours. Thyroid function studies No results for input(s): TSH, T4TOTAL, T3FREE, THYROIDAB in the last 72 hours.  Invalid input(s): FREET3 Anemia work up No results for input(s): VITAMINB12, FOLATE, FERRITIN, TIBC, IRON, RETICCTPCT in the last 72 hours. Urinalysis    Component Value Date/Time   COLORURINE STRAW (A) 11/06/2020 0848   APPEARANCEUR CLEAR 11/06/2020 0848   LABSPEC 1.021 11/06/2020 0848   PHURINE 5.0 11/06/2020 0848   GLUCOSEU >=500 (A) 11/06/2020 0848   HGBUR NEGATIVE 11/06/2020 0848   BILIRUBINUR NEGATIVE 11/06/2020 0848   KETONESUR 80 (A) 11/06/2020 0848   PROTEINUR 30 (A) 11/06/2020 0848   NITRITE NEGATIVE 11/06/2020 0848   LEUKOCYTESUR NEGATIVE 11/06/2020 0848   Sepsis Labs Invalid input(s): PROCALCITONIN,  WBC,  LACTICIDVEN Microbiology Recent Results (from the past 240 hour(s))  Resp Panel by RT-PCR (Flu A&B, Covid) Nasopharyngeal Swab     Status: None   Collection Time: 11/06/20 11:50 AM   Specimen: Nasopharyngeal Swab; Nasopharyngeal(NP) swabs in vial transport medium  Result Value  Ref Range Status   SARS Coronavirus 2 by RT PCR NEGATIVE NEGATIVE Final    Comment: (NOTE) SARS-CoV-2 target nucleic acids are NOT DETECTED.  The SARS-CoV-2 RNA is generally detectable in upper respiratory specimens during the acute phase of infection. The lowest concentration of SARS-CoV-2 viral copies this assay can detect is 138 copies/mL. A negative result does not preclude SARS-Cov-2 infection and should not be used as the sole basis for treatment or other patient management decisions. A negative result may occur with  improper specimen collection/handling, submission of specimen other than nasopharyngeal swab, presence of viral mutation(s) within the areas targeted by this assay, and inadequate number of viral copies(<138 copies/mL). A  negative result must be combined with clinical observations, patient history, and epidemiological information. The expected result is Negative.  Fact Sheet for Patients:  BloggerCourse.comhttps://www.fda.gov/media/152166/download  Fact Sheet for Healthcare Providers:  SeriousBroker.ithttps://www.fda.gov/media/152162/download  This test is no t yet approved or cleared by the [place] FDA and  has been authorized for detection and/or diagnosis of SARS-CoV-2 by FDA under an Emergency Use Authorization (EUA). This EUA will remain  in effect (meaning this test can be used) for the duration of the COVID-19 declaration under Section 564(b)(1) of the Act, 21 U.S.C.section 360bbb-3(b)(1), unless the authorization is terminated  or revoked sooner.       Influenza A by PCR NEGATIVE NEGATIVE Final   Influenza B by PCR NEGATIVE NEGATIVE Final    Comment: (NOTE) The Xpert Xpress SARS-CoV-2/FLU/RSV plus assay is intended as an aid in the diagnosis of influenza from Nasopharyngeal swab specimens and should not be used as a sole basis for treatment. Nasal washings and aspirates are unacceptable for Xpert Xpress SARS-CoV-2/FLU/RSV testing.  Fact Sheet for  Patients: BloggerCourse.comhttps://www.fda.gov/media/152166/download  Fact Sheet for Healthcare Providers: SeriousBroker.ithttps://www.fda.gov/media/152162/download  This test is not yet approved or cleared by the [place] FDA and has been authorized for detection and/or diagnosis of SARS-CoV-2 by FDA under an Emergency Use Authorization (EUA). This EUA will remain in effect (meaning this test can be used) for the duration of the COVID-19 declaration under Section 564(b)(1) of the Act, 21 U.S.C. section 360bbb-3(b)(1), unless the authorization is terminated or revoked.  Performed at [facility], [address] [place], [place] [address]   MRSA PCR Screening     Status: None   Collection Time: 11/06/20  4:08 PM   Specimen: Nasal Mucosa; Nasopharyngeal  Result Value Ref Range Status   MRSA by PCR NEGATIVE NEGATIVE Final    Comment:        The GeneXpert MRSA Assay (FDA approved for NASAL specimens only), is one component of a comprehensive MRSA colonization surveillance program. It is not intended to diagnose MRSA infection nor to guide or monitor treatment for MRSA infections. Performed at [facility], [address] [place], [place] [address]   Group A Strep by PCR     Status: None   Collection Time: 11/07/20  8:57 AM   Specimen: Throat; Sterile Swab  Result Value Ref Range Status   Group A Strep by PCR NOT DETECTED NOT DETECTED Final    Comment: Performed at [facility], [address] [place], [place] [address]     Time coordinating discharge: Over 30 minutes  SIGNED:   Marinda ElkAbraham Feliz Ortiz, MD  [facility] 11/08/2020, 9:47 AM Pager   If 7PM-7AM, please contact night-coverage [email or web address] Password [email or web address]

## 2021-03-12 ENCOUNTER — Other Ambulatory Visit: Payer: Self-pay

## 2021-03-12 ENCOUNTER — Encounter (HOSPITAL_COMMUNITY): Payer: Self-pay

## 2021-03-12 ENCOUNTER — Inpatient Hospital Stay (HOSPITAL_COMMUNITY)
Admission: EM | Admit: 2021-03-12 | Discharge: 2021-03-15 | DRG: 639 | Discharge status: Against Medical Advice | Payer: Self-pay | Attending: Internal Medicine | Admitting: Internal Medicine

## 2021-03-12 DIAGNOSIS — I951 Orthostatic hypotension: Secondary | ICD-10-CM | POA: Diagnosis present

## 2021-03-12 DIAGNOSIS — D72829 Elevated white blood cell count, unspecified: Secondary | ICD-10-CM | POA: Diagnosis present

## 2021-03-12 DIAGNOSIS — E1010 Type 1 diabetes mellitus with ketoacidosis without coma: Principal | ICD-10-CM | POA: Diagnosis present

## 2021-03-12 DIAGNOSIS — E860 Dehydration: Secondary | ICD-10-CM | POA: Diagnosis present

## 2021-03-12 DIAGNOSIS — Z20822 Contact with and (suspected) exposure to covid-19: Secondary | ICD-10-CM | POA: Diagnosis present

## 2021-03-12 DIAGNOSIS — K2920 Alcoholic gastritis without bleeding: Secondary | ICD-10-CM | POA: Diagnosis present

## 2021-03-12 DIAGNOSIS — Z794 Long term (current) use of insulin: Secondary | ICD-10-CM

## 2021-03-12 LAB — URINALYSIS, ROUTINE W REFLEX MICROSCOPIC
Bacteria, UA: NONE SEEN
Bilirubin Urine: NEGATIVE
Glucose, UA: >=500 mg/dL — AB
Hgb urine dipstick: NEGATIVE
Ketones, ur: 80 mg/dL — AB
Leukocytes,Ua: NEGATIVE
Nitrite: NEGATIVE
Protein, ur: NEGATIVE mg/dL
Specific Gravity, Urine: 1.03 (ref 1.005–1.030)
pH: 6 (ref 5.0–8.0)

## 2021-03-12 LAB — BLOOD GAS, VENOUS
Acid-base deficit: 2.5 mmol/L — ABNORMAL HIGH (ref 0.0–2.0)
Bicarbonate: 22.4 mmol/L (ref 20.0–28.0)
O2 Saturation: 90.5 %
Patient temperature: 98.6
pCO2, Ven: 41.4 mmHg — ABNORMAL LOW (ref 44.0–60.0)
pH, Ven: 7.352 (ref 7.250–7.430)
pO2, Ven: 65.9 mmHg — ABNORMAL HIGH (ref 32.0–45.0)

## 2021-03-12 LAB — BASIC METABOLIC PANEL
Anion gap: 19 — ABNORMAL HIGH (ref 5–15)
BUN: 26 mg/dL — ABNORMAL HIGH (ref 6–20)
CO2: 21 mmol/L — ABNORMAL LOW (ref 22–32)
Calcium: 9.7 mg/dL (ref 8.9–10.3)
Chloride: 98 mmol/L (ref 98–111)
Creatinine, Ser: 0.92 mg/dL (ref 0.61–1.24)
GFR, Estimated: >60 mL/min (ref >60)
Glucose, Bld: 374 mg/dL — ABNORMAL HIGH (ref 70–99)
Potassium: 4.6 mmol/L (ref 3.5–5.1)
Sodium: 138 mmol/L (ref 135–145)

## 2021-03-12 LAB — CBC
HCT: 42.1 % (ref 39.0–52.0)
Hemoglobin: 13.4 g/dL (ref 13.0–17.0)
MCH: 26.9 pg (ref 26.0–34.0)
MCHC: 31.8 g/dL (ref 30.0–36.0)
MCV: 84.4 fL (ref 80.0–100.0)
Platelets: 342 10*3/uL (ref 150–400)
RBC: 4.99 MIL/uL (ref 4.22–5.81)
RDW: 14 % (ref 11.5–15.5)
WBC: 12.5 10*3/uL — ABNORMAL HIGH (ref 4.0–10.5)
nRBC: 0 % (ref 0.0–0.2)

## 2021-03-12 LAB — CBG MONITORING, ED
Glucose-Capillary: 366 mg/dL — ABNORMAL HIGH (ref 70–99)
Glucose-Capillary: 352 mg/dL — ABNORMAL HIGH (ref 70–99)
Glucose-Capillary: 223 mg/dL — ABNORMAL HIGH (ref 70–99)

## 2021-03-12 MED ORDER — DEXTROSE IN LACTATED RINGERS 5 % IV SOLN
INTRAVENOUS | Status: DC
Start: 2021-03-12 — End: 2021-03-12

## 2021-03-12 MED ORDER — LACTATED RINGERS IV SOLN: INTRAVENOUS | Status: DC

## 2021-03-12 MED ORDER — ONDANSETRON HCL 4 MG/2ML IJ SOLN
4.0000 mg | Freq: Once | INTRAMUSCULAR | Status: AC
  Administered 2021-03-12: 4 mg via INTRAVENOUS
  Filled 2021-03-12: qty 2

## 2021-03-12 MED ORDER — ACETAMINOPHEN 325 MG PO TABS: 650.0000 mg | ORAL_TABLET | Freq: Four times a day (QID) | ORAL | Status: DC | PRN

## 2021-03-12 MED ORDER — DEXTROSE IN LACTATED RINGERS 5 % IV SOLN: INTRAVENOUS | Status: DC

## 2021-03-12 MED ORDER — DEXTROSE 50 % IV SOLN: 0.0000 mL | INTRAVENOUS | Status: DC | PRN

## 2021-03-12 MED ORDER — POTASSIUM CHLORIDE 10 MEQ/100ML IV SOLN
10.0000 meq | INTRAVENOUS | Status: AC
  Administered 2021-03-12: 10 meq via INTRAVENOUS
  Filled 2021-03-12 (×2): qty 100

## 2021-03-12 MED ORDER — ADULT MULTIVITAMIN W/MINERALS CH
1.0000 | ORAL_TABLET | Freq: Every day | ORAL | Status: DC
  Administered 2021-03-13 – 2021-03-15 (×3): 1 via ORAL
  Filled 2021-03-12 (×3): qty 1

## 2021-03-12 MED ORDER — POTASSIUM CHLORIDE 10 MEQ/100ML IV SOLN
10.0000 meq | INTRAVENOUS | Status: AC
  Administered 2021-03-12 – 2021-03-13 (×2): 10 meq via INTRAVENOUS
  Filled 2021-03-12: qty 100

## 2021-03-12 MED ORDER — INSULIN REGULAR(HUMAN) IN NACL 100-0.9 UT/100ML-% IV SOLN
INTRAVENOUS | Status: DC
  Administered 2021-03-12: 9.5 [IU]/h via INTRAVENOUS
  Filled 2021-03-12: qty 100

## 2021-03-12 MED ORDER — ACETAMINOPHEN 650 MG RE SUPP: 650.0000 mg | Freq: Four times a day (QID) | RECTAL | Status: DC | PRN

## 2021-03-12 MED ORDER — INSULIN REGULAR(HUMAN) IN NACL 100-0.9 UT/100ML-% IV SOLN
INTRAVENOUS | Status: DC
  Administered 2021-03-12: 3.6 [IU]/h via INTRAVENOUS

## 2021-03-12 MED ORDER — ONDANSETRON HCL 4 MG PO TABS
4.0000 mg | ORAL_TABLET | Freq: Four times a day (QID) | ORAL | Status: DC | PRN
  Administered 2021-03-13: 4 mg via ORAL
  Filled 2021-03-12: qty 1

## 2021-03-12 MED ORDER — FOLIC ACID 1 MG PO TABS
1.0000 mg | ORAL_TABLET | Freq: Every day | ORAL | Status: DC
  Administered 2021-03-13 – 2021-03-15 (×3): 1 mg via ORAL
  Filled 2021-03-12 (×3): qty 1

## 2021-03-12 MED ORDER — THIAMINE HCL 100 MG PO TABS
100.0000 mg | ORAL_TABLET | Freq: Every day | ORAL | Status: DC
  Administered 2021-03-13 – 2021-03-15 (×3): 100 mg via ORAL
  Filled 2021-03-12 (×3): qty 1

## 2021-03-12 MED ORDER — ONDANSETRON HCL 4 MG/2ML IJ SOLN
4.0000 mg | Freq: Four times a day (QID) | INTRAMUSCULAR | Status: DC | PRN
  Administered 2021-03-13: 4 mg via INTRAVENOUS
  Filled 2021-03-12: qty 2

## 2021-03-12 MED ORDER — SODIUM CHLORIDE 0.9 % IV BOLUS
1000.0000 mL | INTRAVENOUS | Status: AC
  Administered 2021-03-12: 1000 mL via INTRAVENOUS

## 2021-03-12 NOTE — ED Provider Notes (Signed)
[facility]-EMERGENCY DEPT Provider Note   CSN: [id number] Arrival date & time: 03/12/21  1909     History Chief Complaint  Patient presents with   Hyperglycemia    Craig Solis is a 25 y.o. male Who presents emergency department with a chief complaint of hyperglycemia and vomiting.  Patient states that his birthday was yesterday, the evening before he was drinking heavily.  He states he woke up the next day with vomiting which she was unable to control and thinks this set off his DKA.  States it feels the same as DKA in the past.  He has not had DKA in a long time.  He has no abdominal pain just feels very nauseous.  He has been urinating heavily and vomiting which prompted him to seek help.  He complains of excessive thirst.   Hyperglycemia     Past Medical History:  Diagnosis Date   Diabetes mellitus without complication [facility])     Patient Active Problem List   Diagnosis Date Noted   DKA (diabetic ketoacidosis) (HCC) 11/06/2020   AKI (acute kidney injury) (HCC) 11/06/2020   Hyponatremia 11/06/2020   Leukocytosis 11/06/2020   Dehydration 12/08/2019   DKA, type 1 (HCC) 12/08/2019   Hypokalemia 12/08/2019   DKA (diabetic ketoacidoses) 12/08/2019    No past surgical history on file.     Family History  Problem Relation Age of Onset   Hypersomnolence Mother    Hypersomnolence Father    Hypertension Neg Hx    Diabetes Neg Hx     Social History   Tobacco Use   Smoking status: Never   Smokeless tobacco: Never  Substance Use Topics   Alcohol use: Yes   Drug use: Not Currently    Home Medications Prior to Admission medications   Medication Sig Start Date End Date Taking? Authorizing Provider  insulin aspart (FIASP FLEXTOUCH) 100 UNIT/ML FlexTouch Pen Inject 4-12 Units into the skin 3 (three) times daily before meals. Sliding scale per Patient : not provided 11/08/20  Yes Marinda Elk, MD  insulin degludec (TRESIBA FLEXTOUCH) 100  UNIT/ML FlexTouch Pen Inject 20 Units into the skin daily. Patient taking differently: Inject 18 Units into the skin daily. 11/08/20  Yes Marinda Elk, MD    Allergies    Patient has no known allergies.  Review of Systems   Review of Systems Ten systems reviewed and are negative for acute change, except as noted in the HPI.   Physical Exam Updated Vital Signs BP 99/68   Pulse 98   Temp 98.2 F (36.8 C) (Oral)   Resp 14   Ht 5\' 10"  (1.778 m)   Wt 63.5 kg   SpO2 97%   BMI 20.09 kg/m   Physical Exam Vitals and nursing note reviewed.  Constitutional:      General: He is not in acute distress.    Appearance: He is well-developed. He is not diaphoretic.  HENT:     Head: Normocephalic and atraumatic.     Mouth/Throat:     Mouth: Mucous membranes are dry.  Eyes:     General: No scleral icterus.    Conjunctiva/sclera: Conjunctivae normal.  Cardiovascular:     Rate and Rhythm: Regular rhythm. Tachycardia present.     Heart sounds: Normal heart sounds.  Pulmonary:     Effort: Pulmonary effort is normal. No respiratory distress.     Breath sounds: Normal breath sounds.  Abdominal:     Palpations: Abdomen is soft.  Tenderness: There is no abdominal tenderness.  Musculoskeletal:     Cervical back: Normal range of motion and neck supple.  Skin:    General: Skin is warm and dry.  Neurological:     Mental Status: He is alert.  Psychiatric:        Behavior: Behavior normal.    ED Results / Procedures / Treatments   Labs (all labs ordered are listed, but only abnormal results are displayed) Labs Reviewed  BASIC METABOLIC PANEL - Abnormal; Notable for the following components:      Result Value   CO2 21 (*)    Glucose, Bld 374 (*)    BUN 26 (*)    Anion gap 19 (*)    All other components within normal limits  CBC - Abnormal; Notable for the following components:   WBC 12.5 (*)    All other components within normal limits  URINALYSIS, ROUTINE W REFLEX  MICROSCOPIC - Abnormal; Notable for the following components:   Color, Urine STRAW (*)    Glucose, UA >=500 (*)    Ketones, ur 80 (*)    All other components within normal limits  CBG MONITORING, ED - Abnormal; Notable for the following components:   Glucose-Capillary 366 (*)    All other components within normal limits  BETA-HYDROXYBUTYRIC ACID  BETA-HYDROXYBUTYRIC ACID  BLOOD GAS, VENOUS    EKG None  Radiology No results found.  Procedures .Critical Care  Date/Time: 03/13/2021 8:58 AM Performed by: Arthor Captain, PA-C Authorized by: Arthor Captain, PA-C   Critical care provider statement:    Critical care time (minutes):  45   Critical care time was exclusive of:  Separately billable procedures and treating other patients   Critical care was necessary to treat or prevent imminent or life-threatening deterioration of the following conditions:  Metabolic crisis   Critical care was time spent personally by me on the following activities:  Discussions with consultants, evaluation of patient's response to treatment, examination of patient, ordering and performing treatments and interventions, ordering and review of laboratory studies, ordering and review of radiographic studies, pulse oximetry, re-evaluation of patient's condition, obtaining history from patient or surrogate and review of old charts   Medications Ordered in ED Medications  insulin regular, human (MYXREDLIN) 100 units/ 100 mL infusion (has no administration in time range)  lactated ringers infusion (has no administration in time range)  dextrose 5 % in lactated ringers infusion (has no administration in time range)  dextrose 50 % solution 0-50 mL (has no administration in time range)  sodium chloride 0.9 % bolus 1,000 mL (has no administration in time range)  potassium chloride 10 mEq in 100 mL IVPB (has no administration in time range)  ondansetron (ZOFRAN) injection 4 mg (has no administration in time range)     ED Course  I have reviewed the triage vital signs and the nursing notes.  Pertinent labs & imaging results that were available during my care of the patient were reviewed by me and considered in my medical decision making (see chart for details).    MDM Rules/Calculators/A&P                          25 year old male with a past medical history of type 1 diabetes who presents emergency department with abdominal pain, nausea, vomiting, inability to hold down fluids.  He has a distant history of DKA and feels that this current episode was likely set off by him  celebrating and drinking too much for his birthday yesterday.  I ordered and reviewed labs that show a glucose level of 374.With associated urinary ketones and anion gap of 19 suggestive of DKA.  Patient placed on high-volume fluid bolus and IV insulin drip and given 2 g of IV potassium.  He has not had any recent illnesses denies chest pain as another source of potential cause of DKA.Patient given antiemetics with significant relief in his symptoms.  Case discussed with Dr. Rachael Darby who will admit the patient.  Final Clinical Impression(s) / ED Diagnoses Final diagnoses:  Diabetic ketoacidosis without coma associated with type 1 diabetes mellitus [facility])    Rx / DC Orders ED Discharge Orders     None        Arthor Captain, PA-C 03/13/21 0947    Glendora Score, MD 03/13/21 1512

## 2021-03-12 NOTE — H&P (Signed)
History and Physical    Craig Solis KZS:[id number] DOB: [birth year] DOA: 03/12/2021  PCP: Patient, No Pcp Per (Inactive)   Patient coming from: Home  Chief Complaint: nausea and vomiting. "Feels like when I go into DKA"  HPI: Craig Solis is a 25 y.o. male with medical history significant for type 1 DM on insulin who went out with friends to party for his birthday last night and drank "a lot of alcohol". He generally does not drink alcohol but on his birthday he decided to.  This morning he woke up not feeling well and having a stomachache with nausea and vomiting.  He reports he has vomited multiple times over the course the day. He states he feels like he does when he goes into DKA.  Denies having abdominal pain but he does have nausea still but has not vomited since arriving in the emergency room.  He has not had any fever.   ED Course: He has been hemodynamically stable in the emergency room and has been started on IV fluids and insulin infusion.  Beta-hydroxybutyrate has been ordered and is pending.  Sodium 138 potassium 4.6 chloride 98 bicarb 21 creatinine 0.92 BUN 26 glucose 374, VBG shows pH 7.352 PCO2 41.4 PO2 65.9 bicarb 22.4 WBC 12,500 hemoglobin 13.4 hematocrit 42.1 platelets 342,000 urinalysis is clear with large glucose and ketones, negative nitrite and leukocytes.  Hospitalist has been asked to admit for further management  Review of Systems:  General: Denies fever, chills, weight loss, night sweats.  Denies dizziness.   HENT: Denies head trauma, headache, denies change in hearing, tinnitus. Denies nasal congestion. Denies sore throat.  Denies difficulty swallowing Eyes: Denies blurry vision, pain in eye, drainage.  Denies discoloration of eyes. Neck: Denies pain.  Denies swelling.  Denies pain with movement. Cardiovascular: Denies chest pain, palpitations. Denies edema.  Denies orthopnea Respiratory: Denies shortness of breath, cough Denies wheezing.  Denies sputum  production Gastrointestinal: Reports nausea and vomiting. Denies abdominal pain, swelling.  Denies diarrhea.  Denies melena.  Denies hematemesis. Musculoskeletal: Denies limitation of movement.  Denies deformity or swelling. Denies arthralgias or myalgias. Genitourinary: Denies pelvic pain.  Denies urinary frequency or hesitancy.  Denies dysuria.  Skin: Denies rash.  Denies petechiae, purpura, ecchymosis. Neurological: Denies syncope. Denies seizure activity. Denies paresthesia. Denies slurred speech, drooping face.  Denies visual change. Psychiatric: Denies depression, anxiety. Denies hallucinations.  Past Medical History:  Diagnosis Date   Diabetes mellitus without complication (HCC)     History reviewed. No pertinent surgical history.  Social History  reports that he has never smoked. He has never used smokeless tobacco. He reports current alcohol use. He reports that he does not currently use drugs.  No Known Allergies  Family History  Problem Relation Age of Onset   Hypersomnolence Mother    Hypersomnolence Father    Hypertension Neg Hx    Diabetes Neg Hx      Prior to Admission medications   Medication Sig Start Date End Date Taking? Authorizing Provider  insulin aspart (FIASP FLEXTOUCH) 100 UNIT/ML FlexTouch Pen Inject 4-12 Units into the skin 3 (three) times daily before meals. Sliding scale per Patient : not provided 11/08/20  Yes Marinda Elk, MD  insulin degludec (TRESIBA FLEXTOUCH) 100 UNIT/ML FlexTouch Pen Inject 20 Units into the skin daily. Patient taking differently: Inject 18 Units into the skin daily. 11/08/20  Yes Marinda Elk, MD    Physical Exam: Vitals:   03/12/21 2130 03/12/21 2145 03/12/21 2200 03/12/21 2215  BP: 118/82 116/84 118/79 115/78  Pulse: 94 98 100 (!) 104  Resp: 13 14 11 12   Temp:      TempSrc:      SpO2: 98% 99% 98% 97%  Weight:      Height:        Constitutional: NAD, calm, comfortable Vitals:   03/12/21 2130  03/12/21 2145 03/12/21 2200 03/12/21 2215  BP: 118/82 116/84 118/79 115/78  Pulse: 94 98 100 (!) 104  Resp: 13 14 11 12   Temp:      TempSrc:      SpO2: 98% 99% 98% 97%  Weight:      Height:       General: WDWN, thin, male, Alert and oriented x3.  Eyes: EOMI, PERRL, conjunctivae normal.  Sclera nonicteric HENT:  [place]/AT, external ears normal.  Nares patent without epistasis.  Mucous membranes are dry Neck: Soft, normal range of motion, supple, no masses, Trachea midline Respiratory: clear to auscultation bilaterally, no wheezing, no crackles. Normal respiratory effort. No accessory muscle use.  Cardiovascular: Regular rate and rhythm, no murmurs / rubs / gallops. No extremity edema. Abdomen: Soft, no tenderness, nondistended, no rebound or guarding.  No masses palpated. No hepatosplenomegaly. Bowel sounds normoactive Musculoskeletal: FROM. no cyanosis. No joint deformity upper and lower extremities. no contractures. Normal muscle tone.  Skin: Warm, dry, intact no rashes, lesions, ulcers. No induration.  Multiple tattoos Neurologic: CN 2-12 grossly intact.  Normal speech.  Sensation intact to touch. Strength 5/5 in all extremities.   Psychiatric: Normal judgment and insight.  Normal mood.    Labs on Admission: I have personally reviewed following labs and imaging studies  CBC: Recent Labs  Lab 03/12/21 1921  WBC 12.5*  HGB 13.4  HCT 42.1  MCV 84.4  PLT 342    Basic Metabolic Panel: Recent Labs  Lab 03/12/21 1921  NA 138  K 4.6  CL 98  CO2 21*  GLUCOSE 374*  BUN 26*  CREATININE 0.92  CALCIUM 9.7    GFR: Estimated Creatinine Clearance: 108.3 mL/min (by C-G formula based on SCr of 0.92 mg/dL).  Liver Function Tests: No results for input(s): AST, ALT, ALKPHOS, BILITOT, PROT, ALBUMIN in the last 168 hours.  Urine analysis:    Component Value Date/Time   COLORURINE STRAW (A) 03/12/2021 1921   APPEARANCEUR CLEAR 03/12/2021 1921   LABSPEC 1.030 03/12/2021 1921    PHURINE 6.0 03/12/2021 1921   GLUCOSEU >=500 (A) 03/12/2021 1921   HGBUR NEGATIVE 03/12/2021 1921   BILIRUBINUR NEGATIVE 03/12/2021 1921   KETONESUR 80 (A) 03/12/2021 1921   PROTEINUR NEGATIVE 03/12/2021 1921   NITRITE NEGATIVE 03/12/2021 1921   LEUKOCYTESUR NEGATIVE 03/12/2021 1921    Radiological Exams on Admission: No results found.   Assessment/Plan Principal Problem:   DKA, type 1, not at goal  Craig Solis is admitted to [facility] unit with early DKA with n,v. Has been given IVF bolus and is on insulin infusion DKA protocol ordered and will transition to basal insulin and home regimen when blood sugar decreased.  NPO until blood sugar improved per protocol. Monitor electrolytes and beta hydroxybutyrate Check hemoglobin A1c.  Active Problems:   Dehydration IV fluid hydration.    Leukocytosis Mild increase in WBCs most likely secondary to shift from nausea and vomiting.  Patient has no signs or symptoms of infection.    DVT prophylaxis: Padua score low. Early ambulation for DVT prophylaxis.   Code Status:   Full Code  Family Communication:  Diagnosis and plan  discussed with patient.  Patient verbalized understanding and agrees with plan.  Further recommendations to follow as clinical indicated Disposition Plan:   Patient is from:  Home  Anticipated DC to:  Home  Anticipated DC date:  Anticipate 2 midnight stay  Admission status:  Inpatient   Claudean Severance Walaa Carel MD [facility]  How to contact the [facility] Attending or Consulting provider 7A - 7P or covering provider during after hours 7P -7A, for this patient?   Check the care team in [facility] and look for a) attending/consulting TRH provider listed and b) the [facility] team listed Log into [email or web address] and use [place]'s universal password to access. If you do not have the password, please contact the hospital operator. Locate the [facility] provider you are looking for under [facility] and page to a number that you  can be directly reached. If you still have difficulty reaching the provider, please page the [facility] (Director on Call) for the Hospitalists listed on amion for assistance.  03/12/2021, 10:54 PM

## 2021-03-12 NOTE — ED Triage Notes (Signed)
Per ems: Pt coming from home c/o N/V x2 days with generalized abdominal pain. Hx DM type 1. CBG 395  128/88 98 nsr  16 RR 98% RA  Given 500 NS en route

## 2021-03-13 ENCOUNTER — Other Ambulatory Visit: Payer: Self-pay

## 2021-03-13 LAB — CBG MONITORING, ED
Glucose-Capillary: 144 mg/dL — ABNORMAL HIGH (ref 70–99)
Glucose-Capillary: 145 mg/dL — ABNORMAL HIGH (ref 70–99)
Glucose-Capillary: 157 mg/dL — ABNORMAL HIGH (ref 70–99)
Glucose-Capillary: 169 mg/dL — ABNORMAL HIGH (ref 70–99)
Glucose-Capillary: 180 mg/dL — ABNORMAL HIGH (ref 70–99)
Glucose-Capillary: 183 mg/dL — ABNORMAL HIGH (ref 70–99)
Glucose-Capillary: 190 mg/dL — ABNORMAL HIGH (ref 70–99)
Glucose-Capillary: 190 mg/dL — ABNORMAL HIGH (ref 70–99)

## 2021-03-13 LAB — HIV ANTIBODY (ROUTINE TESTING W REFLEX): HIV Screen 4th Generation wRfx: NONREACTIVE

## 2021-03-13 LAB — CBC
HCT: 40.8 % (ref 39.0–52.0)
Hemoglobin: 12.9 g/dL — ABNORMAL LOW (ref 13.0–17.0)
MCH: 26.5 pg (ref 26.0–34.0)
MCHC: 31.6 g/dL (ref 30.0–36.0)
MCV: 83.8 fL (ref 80.0–100.0)
Platelets: 328 10*3/uL (ref 150–400)
RBC: 4.87 MIL/uL (ref 4.22–5.81)
RDW: 13.9 % (ref 11.5–15.5)
WBC: 11.4 10*3/uL — ABNORMAL HIGH (ref 4.0–10.5)
nRBC: 0 % (ref 0.0–0.2)

## 2021-03-13 LAB — BASIC METABOLIC PANEL
Anion gap: 11 (ref 5–15)
Anion gap: 11 (ref 5–15)
Anion gap: 13 (ref 5–15)
BUN: 16 mg/dL (ref 6–20)
BUN: 20 mg/dL (ref 6–20)
BUN: 25 mg/dL — ABNORMAL HIGH (ref 6–20)
CO2: 22 mmol/L (ref 22–32)
CO2: 25 mmol/L (ref 22–32)
CO2: 27 mmol/L (ref 22–32)
Calcium: 9.6 mg/dL (ref 8.9–10.3)
Calcium: 9.6 mg/dL (ref 8.9–10.3)
Calcium: 9.9 mg/dL (ref 8.9–10.3)
Chloride: 101 mmol/L (ref 98–111)
Chloride: 101 mmol/L (ref 98–111)
Chloride: 99 mmol/L (ref 98–111)
Creatinine, Ser: 0.66 mg/dL (ref 0.61–1.24)
Creatinine, Ser: 0.7 mg/dL (ref 0.61–1.24)
Creatinine, Ser: 0.9 mg/dL (ref 0.61–1.24)
GFR, Estimated: >60 mL/min (ref >60)
GFR, Estimated: >60 mL/min (ref >60)
GFR, Estimated: >60 mL/min (ref >60)
Glucose, Bld: 171 mg/dL — ABNORMAL HIGH (ref 70–99)
Glucose, Bld: 191 mg/dL — ABNORMAL HIGH (ref 70–99)
Glucose, Bld: 197 mg/dL — ABNORMAL HIGH (ref 70–99)
Potassium: 3.4 mmol/L — ABNORMAL LOW (ref 3.5–5.1)
Potassium: 3.5 mmol/L (ref 3.5–5.1)
Potassium: 3.8 mmol/L (ref 3.5–5.1)
Sodium: 136 mmol/L (ref 135–145)
Sodium: 137 mmol/L (ref 135–145)
Sodium: 137 mmol/L (ref 135–145)

## 2021-03-13 LAB — GLUCOSE, CAPILLARY
Glucose-Capillary: 207 mg/dL — ABNORMAL HIGH (ref 70–99)
Glucose-Capillary: 92 mg/dL (ref 70–99)

## 2021-03-13 LAB — SARS CORONAVIRUS 2 (TAT 6-24 HRS): SARS Coronavirus 2: NEGATIVE

## 2021-03-13 LAB — BETA-HYDROXYBUTYRIC ACID
Beta-Hydroxybutyric Acid: 1.86 mmol/L — ABNORMAL HIGH (ref 0.05–0.27)
Beta-Hydroxybutyric Acid: 2.97 mmol/L — ABNORMAL HIGH (ref 0.05–0.27)
Beta-Hydroxybutyric Acid: 5.22 mmol/L — ABNORMAL HIGH (ref 0.05–0.27)

## 2021-03-13 MED ORDER — INSULIN ASPART 100 UNIT/ML IJ SOLN
0.0000 [IU] | Freq: Three times a day (TID) | INTRAMUSCULAR | Status: DC
  Administered 2021-03-13: 2 [IU] via SUBCUTANEOUS
  Administered 2021-03-13: 3 [IU] via SUBCUTANEOUS
  Administered 2021-03-14: 1 [IU] via SUBCUTANEOUS
  Administered 2021-03-15: 2 [IU] via SUBCUTANEOUS
  Filled 2021-03-13: qty 0.09

## 2021-03-13 MED ORDER — METOCLOPRAMIDE HCL 5 MG/ML IJ SOLN
5.0000 mg | Freq: Four times a day (QID) | INTRAMUSCULAR | Status: DC
  Administered 2021-03-13 – 2021-03-15 (×10): 5 mg via INTRAVENOUS
  Filled 2021-03-13 (×10): qty 2

## 2021-03-13 MED ORDER — INSULIN ASPART 100 UNIT/ML IJ SOLN
4.0000 [IU] | Freq: Three times a day (TID) | INTRAMUSCULAR | Status: DC
  Administered 2021-03-13 – 2021-03-15 (×6): 4 [IU] via SUBCUTANEOUS
  Filled 2021-03-13: qty 0.04

## 2021-03-13 MED ORDER — INSULIN GLARGINE-YFGN 100 UNIT/ML ~~LOC~~ SOLN
20.0000 [IU] | Freq: Every day | SUBCUTANEOUS | Status: DC
  Administered 2021-03-13 – 2021-03-14 (×2): 20 [IU] via SUBCUTANEOUS
  Filled 2021-03-13 (×3): qty 0.2

## 2021-03-13 MED ORDER — INSULIN ASPART 100 UNIT/ML IJ SOLN
0.0000 [IU] | Freq: Every day | INTRAMUSCULAR | Status: DC
  Filled 2021-03-13: qty 0.05

## 2021-03-13 MED ORDER — SUCRALFATE 1 GM/10ML PO SUSP
1.0000 g | Freq: Three times a day (TID) | ORAL | Status: DC
  Administered 2021-03-13 – 2021-03-15 (×8): 1 g via ORAL
  Filled 2021-03-13 (×8): qty 10

## 2021-03-13 MED ORDER — SODIUM CHLORIDE 0.9 % IV BOLUS
1000.0000 mL | Freq: Once | INTRAVENOUS | Status: AC
  Administered 2021-03-13: 1000 mL via INTRAVENOUS

## 2021-03-13 MED ORDER — INSULIN DEGLUDEC 100 UNIT/ML ~~LOC~~ SOPN: 20.0000 [IU] | PEN_INJECTOR | Freq: Every day | SUBCUTANEOUS | Status: DC

## 2021-03-13 MED ORDER — PANTOPRAZOLE SODIUM 40 MG PO TBEC
40.0000 mg | DELAYED_RELEASE_TABLET | Freq: Every day | ORAL | Status: DC
  Administered 2021-03-13 – 2021-03-15 (×3): 40 mg via ORAL
  Filled 2021-03-13 (×3): qty 1

## 2021-03-13 NOTE — ED Notes (Addendum)
MD contacted for transition orders from IV to SQ.   Instructed to administer sq dose and then cut IV insulin in half.   60-90 minutes after sq dose. Stop IV insulin and cut IVF to 1/2.   POC CBG readings Q1H x 3.

## 2021-03-13 NOTE — Progress Notes (Signed)
Inpatient Diabetes Program Recommendations  AACE/ADA: New Consensus Statement on Inpatient Glycemic Control (2015)  Target Ranges:  Prepandial:   less than 140 mg/dL      Peak postprandial:   less than 180 mg/dL (1-2 hours)      Critically ill patients:  140 - 180 mg/dL   Lab Results  Component Value Date   GLUCAP 190 (H) 03/13/2021   HGBA1C 11.9 (H) 11/07/2020    Review of Glycemic Control  Diabetes history: DM1 Outpatient Diabetes medications: Tresiba 18 units QD, Fiasp 4-12 units TID with meals Current orders for Inpatient glycemic control: Semglee 20 units QD, Novolog 0-9 units TID with meals and 0-5 HS  HgbA1C - pending.  Last one - 11.9% on 11/07/20 CHO mod diet - will need meal coverage insulin if eating > 50%  Inpatient Diabetes Program Recommendations:    Add Novolog 4 units TID with meals if eating > 50%. Awaiting HgbA1C results.  Will see pt later this am.   Thank you. Ailene Ards, RD, LDN, CDE Inpatient Diabetes Coordinator [phone]

## 2021-03-13 NOTE — Plan of Care (Signed)

## 2021-03-13 NOTE — ED Notes (Signed)
Per MD, IVF to stop at this time.

## 2021-03-13 NOTE — Progress Notes (Signed)
Progress Note    Craig Solis  JKD:[id number] DOB: December 13, 1995  DOA: 03/12/2021 PCP: Patient, No Pcp Per (Inactive)    Brief Narrative:     Medical records reviewed and are as summarized below:  Craig Solis is an 25 y.o. male with medical history significant for type 1 DM on insulin who went out with friends to party for his birthday last night and drank "a lot of alcohol". He generally does not drink alcohol but on his birthday he decided to.  This morning he woke up not feeling well and having a stomachache with nausea and vomiting.  He reports he has vomited multiple times over the course the day. He states he feels like he does when he goes into DKA.  Assessment/Plan:   Principal Problem:   DKA, type 1, not at goal [facility]) Active Problems:   Dehydration   Leukocytosis   DKA, type 1, not at goal  -was transitioned off insulin gtt early this AM -SQ insulin started along with SSI -zofran PRN -scheduled reglan for now -carb mod diet -still appears dehydrated so will give IVF     Leukocytosis -trend, no sign of infection   Family Communication/Anticipated D/C date and plan/Code Status   DVT prophylaxis: Lovenox ordered. Code Status: Full Code.  Disposition Plan: Status is: Inpatient  Remains inpatient appropriate because:IV treatments appropriate due to intensity of illness or inability to take PO  Dispo: The patient is from: Home              Anticipated d/c is to: Home              Patient currently is not medically stable to d/c.- home either later today or in the AM.   Difficult to place patient No         Medical Consultants:   None.    Subjective:   C/o hiccups and belching  Objective:    Vitals:   03/13/21 0400 03/13/21 0415 03/13/21 0500 03/13/21 0600  BP: (!) 130/91 111/73 117/81 120/83  Pulse: (!) 104 (!) 104 86 72  Resp: 11 13 12 13   Temp:      TempSrc:      SpO2: 98% 98% 98% 98%  Weight:      Height:         Intake/Output Summary (Last 24 hours) at 03/13/2021 0841 Last data filed at 03/12/2021 2328 Gross per 24 hour  Intake 13 ml  Output --  Net 13 ml   Filed Weights   03/12/21 1917  Weight: 63.5 kg    Exam:  General: Appearance:    Well developed, well nourished male in no acute distress     Lungs:     Clear to auscultation bilaterally, respirations unlabored  Heart:    Normal heart rate. Tachycardic at times    MS:   All extremities are intact.    Neurologic:   Awake, alert, oriented x 3. No apparent focal neurological           defect.      Data Reviewed:   I have personally reviewed following labs and imaging studies:  Labs: Labs show the following:   Basic Metabolic Panel: Recent Labs  Lab 03/12/21 1921 03/12/21 2335 03/13/21 0414  NA 138 136 137  K 4.6 3.4* 3.5  CL 98 101 101  CO2 21* 22 25  GLUCOSE 374* 191* 171*  BUN 26* 25* 20  CREATININE 0.92 0.90 0.70  CALCIUM 9.7 9.6 9.9  GFR Estimated Creatinine Clearance: 124.6 mL/min (by C-G formula based on SCr of 0.7 mg/dL). Liver Function Tests: No results for input(s): AST, ALT, ALKPHOS, BILITOT, PROT, ALBUMIN in the last 168 hours. No results for input(s): LIPASE, AMYLASE in the last 168 hours. No results for input(s): AMMONIA in the last 168 hours. Coagulation profile No results for input(s): INR, PROTIME in the last 168 hours.  CBC: Recent Labs  Lab 03/12/21 1921 03/13/21 0414  WBC 12.5* 11.4*  HGB 13.4 12.9*  HCT 42.1 40.8  MCV 84.4 83.8  PLT 342 328   Cardiac Enzymes: No results for input(s): CKTOTAL, CKMB, CKMBINDEX, TROPONINI in the last 168 hours. BNP (last 3 results) No results for input(s): PROBNP in the last 8760 hours. CBG: Recent Labs  Lab 03/13/21 0251 03/13/21 0356 03/13/21 0510 03/13/21 0618 03/13/21 0725  GLUCAP 183* 180* 144* 157* 190*   D-Dimer: No results for input(s): DDIMER in the last 72 hours. Hgb A1c: No results for input(s): HGBA1C in the last 72  hours. Lipid Profile: No results for input(s): CHOL, HDL, LDLCALC, TRIG, CHOLHDL, LDLDIRECT in the last 72 hours. Thyroid function studies: No results for input(s): TSH, T4TOTAL, T3FREE, THYROIDAB in the last 72 hours.  Invalid input(s): FREET3 Anemia work up: No results for input(s): VITAMINB12, FOLATE, FERRITIN, TIBC, IRON, RETICCTPCT in the last 72 hours. Sepsis Labs: Recent Labs  Lab 03/12/21 1921 03/13/21 0414  WBC 12.5* 11.4*    Microbiology No results found for this or any previous visit (from the past 240 hour(s)).  Procedures and diagnostic studies:  No results found.  Medications:    folic acid  1 mg Oral Daily   insulin aspart  0-5 Units Subcutaneous QHS   insulin aspart  0-9 Units Subcutaneous TID WC   insulin glargine-yfgn  20 Units Subcutaneous Daily   metoCLOPramide (REGLAN) injection  5 mg Intravenous Q6H   multivitamin with minerals  1 tablet Oral Daily   pantoprazole  40 mg Oral Daily   thiamine  100 mg Oral Daily   Continuous Infusions:  sodium chloride       LOS: 1 day   Joseph Art  [facility]   How to contact the [facility] Attending or Consulting provider 7A - 7P or covering provider during after hours 7P -7A, for this patient?  Check the care team in [facility] and look for a) attending/consulting TRH provider listed and b) the [facility] team listed Log into [email or web address] and use [place]'s universal password to access. If you do not have the password, please contact the hospital operator. Locate the [facility] provider you are looking for under [facility] and page to a number that you can be directly reached. If you still have difficulty reaching the provider, please page the [facility] (Director on Call) for the Hospitalists listed on amion for assistance.  03/13/2021, 8:41 AM

## 2021-03-13 NOTE — ED Notes (Signed)
Patient did not eat breakfast or lunch.

## 2021-03-14 LAB — BASIC METABOLIC PANEL
Anion gap: 13 (ref 5–15)
BUN: 14 mg/dL (ref 6–20)
CO2: 26 mmol/L (ref 22–32)
Calcium: 9.3 mg/dL (ref 8.9–10.3)
Chloride: 97 mmol/L — ABNORMAL LOW (ref 98–111)
Creatinine, Ser: 0.58 mg/dL — ABNORMAL LOW (ref 0.61–1.24)
GFR, Estimated: >60 mL/min (ref >60)
Glucose, Bld: 227 mg/dL — ABNORMAL HIGH (ref 70–99)
Potassium: 3.5 mmol/L (ref 3.5–5.1)
Sodium: 136 mmol/L (ref 135–145)

## 2021-03-14 LAB — CBC
HCT: 41.1 % (ref 39.0–52.0)
Hemoglobin: 13.1 g/dL (ref 13.0–17.0)
MCH: 26.7 pg (ref 26.0–34.0)
MCHC: 31.9 g/dL (ref 30.0–36.0)
MCV: 83.9 fL (ref 80.0–100.0)
Platelets: 300 10*3/uL (ref 150–400)
RBC: 4.9 MIL/uL (ref 4.22–5.81)
RDW: 13.6 % (ref 11.5–15.5)
WBC: 8.2 10*3/uL (ref 4.0–10.5)
nRBC: 0 % (ref 0.0–0.2)

## 2021-03-14 LAB — GLUCOSE, CAPILLARY
Glucose-Capillary: 219 mg/dL — ABNORMAL HIGH (ref 70–99)
Glucose-Capillary: 83 mg/dL (ref 70–99)
Glucose-Capillary: 145 mg/dL — ABNORMAL HIGH (ref 70–99)
Glucose-Capillary: 81 mg/dL (ref 70–99)
Glucose-Capillary: 118 mg/dL — ABNORMAL HIGH (ref 70–99)

## 2021-03-14 LAB — HEMOGLOBIN A1C
Hgb A1c MFr Bld: 11.4 % — ABNORMAL HIGH (ref 4.8–5.6)
Mean Plasma Glucose: 280 mg/dL

## 2021-03-14 LAB — TSH: TSH: 1.98 u[IU]/mL (ref 0.350–4.500)

## 2021-03-14 MED ORDER — LACTATED RINGERS IV SOLN: INTRAVENOUS | Status: DC

## 2021-03-14 MED ORDER — PANTOPRAZOLE SODIUM 40 MG PO TBEC: 40.0000 mg | DELAYED_RELEASE_TABLET | Freq: Every day | ORAL | 0 refills | Status: AC

## 2021-03-14 MED ORDER — ONDANSETRON HCL 4 MG PO TABS: 4.0000 mg | ORAL_TABLET | Freq: Four times a day (QID) | ORAL | 0 refills | Status: AC | PRN

## 2021-03-14 MED ORDER — SODIUM CHLORIDE 0.9 % IV BOLUS
1000.0000 mL | Freq: Once | INTRAVENOUS | Status: AC
  Administered 2021-03-14: 1000 mL via INTRAVENOUS

## 2021-03-14 NOTE — Progress Notes (Signed)
   03/14/21 1217  Assess: MEWS Score  BP (!) 62/37  Pulse Rate (!) 151  Level of Consciousness Alert  SpO2 96 %  Assess: MEWS Score  MEWS Temp 0  MEWS Systolic 3  MEWS Pulse 3  MEWS RR 0  MEWS LOC 0  MEWS Score 6  MEWS Score Color Red  Assess: if the MEWS score is Yellow or Red  Were vital signs taken at a resting state? No  Focused Assessment No change from prior assessment  Does the patient meet 2 or more of the SIRS criteria? No  MEWS guidelines implemented *See Row Information* Yes  Treat  MEWS Interventions Escalated (See documentation below)  Pain Scale 0-10  Pain Score 0  Take Vital Signs  Increase Vital Sign Frequency  Red: Q 1hr X 4 then Q 4hr X 4, if remains red, continue Q 4hrs  Escalate  MEWS: Escalate Red: discuss with charge nurse/RN and provider, consider discussing with RRT  Notify: Charge Nurse/RN  Name of Charge Nurse/RN Notified [place], RN  Date Charge Nurse/RN Notified 03/14/21  Time Charge Nurse/RN Notified 1220  Notify: Provider  Provider Name/Title Dayna Barker, MD  Date Provider Notified 03/14/21  Time Provider Notified 1220  Notification Type Page  Notification Reason Critical result (Orthostatic BP)  Provider response See new orders  Date of Provider Response 03/14/21  Time of Provider Response 1221  Notify: Rapid Response  Name of Rapid Response RN Notified Sarah  Date Rapid Response Notified 03/14/21  Time Rapid Response Notified 1243  Document  Patient Outcome Not stable and remains on department  Progress note created (see row info) Yes  Assess: SIRS CRITERIA  SIRS Temperature  0  SIRS Pulse 1  SIRS Respirations  0  SIRS WBC 0  SIRS Score Sum  1  Tele called saying pt. HR went to 150s. Nurse went in to assess pt. And pt. Was still in bed. Pt. Did not report feeling his HR racing or beating faster than normal. Nurse notified provider getting a full set of vitals. Provider asked nurse to walk pt. To see if his HR increase it did and pt became  very dizzy he couldn't make it to the door. Nurse informed doctor, who requested orthostatic BP. Pt. Was very dizzy upon standing while trying to get BP. Pt. Had a significant drop in BP with standing, provider notified. New orders place and cared out. Charge nurse and RR nurse notified and aware of patient condition and plan.

## 2021-03-14 NOTE — Progress Notes (Signed)
Inpatient Diabetes Program Recommendations  AACE/ADA: New Consensus Statement on Inpatient Glycemic Control (2015)  Target Ranges:  Prepandial:   less than 140 mg/dL      Peak postprandial:   less than 180 mg/dL (1-2 hours)      Critically ill patients:  140 - 180 mg/dL   Lab Results  Component Value Date   GLUCAP 145 (H) 03/14/2021   HGBA1C 11.4 (H) 03/12/2021    Review of Glycemic Control  Current orders for Inpatient glycemic control:   Semglee 20 units QD, Novolog 0-9 units TID with meals and 0-5 HS + 4 units TID  Inpatient Diabetes Program Recommendations:    Agree with orders.  Spoke with pt at bedside. York Spaniel he gets his insulin and supplies at the Endocrinology office. Has insulin at home. Said he normally does not drink alcohol, although this is likely cause of DKA. Very dehydrated.  Discussed importance of decreasing HgbA1C to reduce risks of long and short-term complications. 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   Also discussed hypoglycemia s/s and treatment. Pt keeps juice boxes at home, does not have frequent hypoglycemia.   Answered all questions. Appreciative of visit.   Thank you. Ailene Ards, RD, LDN, CDE Inpatient Diabetes Coordinator [phone]

## 2021-03-14 NOTE — Progress Notes (Signed)
PROGRESS NOTE    Zenon Heber  OIB:[id number] DOB: 06/11/1996 DOA: 03/12/2021 PCP: Patient, No Pcp Per (Inactive)   Chief Complaint  Patient presents with   Hyperglycemia   Brief Narrative: 25 year old male with history of type 1 diabetes on long-term insulin who had excessive alcohol drinking for his birthday party and next morning woke up not feeling well having stomachache nausea vomiting multiple episodes seen in the ED, found to have DKA and was admitted. Managed IV fluids IV insulin transition to subcu insulin.  Placed on diet.  At this time DKA resolved blood sugar is stable he has enough insulin at home.  Mild heart wound present suspect gastritis due to alcohol use, will prescribe Protonix and take over-the-counter Maalox.  He had leukocytosis but resolved.   Subjective: Seen this morning.  He is waiting for breakfast.  Later he was able to eat.  But tachycardic with ambulation also hypotensive orthostatic hypotension and symptomatic with dizziness.  Assessment & Plan:  DKA, type 1, not at goal with uncontrolled hyperglycemia Type 1 diabetes on long-term insulin: DKA triggered by excessive alcohol.  DKA Resolved.  Uncontrolled hyperglycemia with A1c 11.4. continue current long-acting insulin, sliding  scale and monitor blood glucose.  Recent Labs  Lab 03/13/21 1728 03/13/21 2208 03/14/21 0536 03/14/21 0749 03/14/21 1215  GLUCAP 207* 92 219* 83 145*     Stomach upset, likley gastritis from alcohol use.  Protonix for 2 weeks.  OTC Maalox Leukocytosis likely from dehydration, resolved  Alcohol use for birthday, apparently not drinking otherwise.:  Thiamine folate supportive care . Counseling.  Dehydration Orthostatic hypotension and tachycardia: Suspect ongoing dehydration given 1 L bolus normal saline then keep on Ringer's lactate overnight.  Encourage oral intake.  Diet Order             Diet Carb Modified Fluid consistency: Thin; Room service appropriate? Yes   Diet effective now                  Patient's Body mass index is 20.09 kg/m. DVT prophylaxis: SCD. Code Status:   Code Status: Full Code  Family Communication: plan of care discussed with patient at bedside. Status is: Inpatient  Remains inpatient appropriate because:IV treatments appropriate due to intensity of illness or inability to take PO and Inpatient level of care appropriate due to severity of illness  Dispo: The patient is from: Home              Anticipated d/c is to: Home              Patient currently is not medically stable to d/c.   Difficult to place patient No       Unresulted Labs (From admission, onward)    None      Medications reviewed:  Scheduled Meds:  folic acid  1 mg Oral Daily   insulin aspart  0-5 Units Subcutaneous QHS   insulin aspart  0-9 Units Subcutaneous TID WC   insulin aspart  4 Units Subcutaneous TID WC   insulin glargine-yfgn  20 Units Subcutaneous Daily   metoCLOPramide (REGLAN) injection  5 mg Intravenous Q6H   multivitamin with minerals  1 tablet Oral Daily   pantoprazole  40 mg Oral Daily   sucralfate  1 g Oral TID WC & HS   thiamine  100 mg Oral Daily   Continuous Infusions:  lactated ringers     Consultants:see note  Procedures:see note Antimicrobials: Anti-infectives (From admission, onward)    None  Culture/Microbiology No results found for: SDES, SPECREQUEST, CULT, REPTSTATUS  Other culture-see note  Objective: Vitals: Today's Vitals   03/14/21 1212 03/14/21 1216 03/14/21 1217 03/14/21 1258  BP: 93/68  (!) 62/37 134/82  Pulse: (!) 127 (!) 123 (!) 151 89  Resp:    15  Temp:    98.3 F (36.8 C)  TempSrc:    Oral  SpO2: 100% 98% 96% 100%  Weight:      Height:      PainSc:        Intake/Output Summary (Last 24 hours) at 03/14/2021 1309 Last data filed at 03/14/2021 1017 Gross per 24 hour  Intake 100 ml  Output --  Net 100 ml   Filed Weights   03/12/21 1917  Weight: 63.5 kg   Weight change:    Intake/Output from previous day: No intake/output data recorded. Intake/Output this shift: Total I/O In: 100 [P.O.:100] Out: -  Filed Weights   03/12/21 1917  Weight: 63.5 kg   Examination: General exam: AAO x3,pleasant, not in distress HEENT:Oral mucosa moist, Ear/Nose WNL grossly,dentition normal. Respiratory system: bilaterally diminished,no use of accessory muscle, non tender. Cardiovascular system: S1 & S2 +,No JVD. Gastrointestinal system: Abdomen soft, NT,ND, BS+. Nervous System:Alert, awake, moving extremities Extremities: No edema, distal peripheral pulses palpable.  Skin: No rashes,no icterus. MSK: Normal muscle bulk,tone, power.  Data Reviewed: I have personally reviewed following labs and imaging studies CBC: Recent Labs  Lab 03/12/21 1921 03/13/21 0414 03/14/21 0614  WBC 12.5* 11.4* 8.2  HGB 13.4 12.9* 13.1  HCT 42.1 40.8 41.1  MCV 84.4 83.8 83.9  PLT 342 328 300   Basic Metabolic Panel: Recent Labs  Lab 03/12/21 1921 03/12/21 2335 03/13/21 0414 03/13/21 0700 03/14/21 0614  NA 138 136 137 137 136  K 4.6 3.4* 3.5 3.8 3.5  CL 98 101 101 99 97*  CO2 21* 22 25 27 26   GLUCOSE 374* 191* 171* 197* 227*  BUN 26* 25* 20 16 14   CREATININE 0.92 0.90 0.70 0.66 0.58*  CALCIUM 9.7 9.6 9.9 9.6 9.3   GFR: Estimated Creatinine Clearance: 124.6 mL/min (A) (by C-G formula based on SCr of 0.58 mg/dL (L)). Liver Function Tests: No results for input(s): AST, ALT, ALKPHOS, BILITOT, PROT, ALBUMIN in the last 168 hours. No results for input(s): LIPASE, AMYLASE in the last 168 hours. No results for input(s): AMMONIA in the last 168 hours. Coagulation Profile: No results for input(s): INR, PROTIME in the last 168 hours. Cardiac Enzymes: No results for input(s): CKTOTAL, CKMB, CKMBINDEX, TROPONINI in the last 168 hours. BNP (last 3 results) No results for input(s): PROBNP in the last 8760 hours. HbA1C: Recent Labs    03/12/21 2335  HGBA1C 11.4*   CBG: Recent  Labs  Lab 03/13/21 1728 03/13/21 2208 03/14/21 0536 03/14/21 0749 03/14/21 1215  GLUCAP 207* 92 219* 83 145*   Lipid Profile: No results for input(s): CHOL, HDL, LDLCALC, TRIG, CHOLHDL, LDLDIRECT in the last 72 hours. Thyroid Function Tests: No results for input(s): TSH, T4TOTAL, FREET4, T3FREE, THYROIDAB in the last 72 hours. Anemia Panel: No results for input(s): VITAMINB12, FOLATE, FERRITIN, TIBC, IRON, RETICCTPCT in the last 72 hours. Sepsis Labs: No results for input(s): PROCALCITON, LATICACIDVEN in the last 168 hours.  Recent Results (from the past 240 hour(s))  SARS CORONAVIRUS 2 (TAT 6-24 HRS) Nasopharyngeal Nasopharyngeal Swab     Status: None   Collection Time: 03/12/21 11:35 PM   Specimen: Nasopharyngeal Swab  Result Value Ref Range Status  SARS Coronavirus 2 NEGATIVE NEGATIVE Final    Comment: (NOTE) SARS-CoV-2 target nucleic acids are NOT DETECTED.  The SARS-CoV-2 RNA is generally detectable in upper and lower respiratory specimens during the acute phase of infection. Negative results do not preclude SARS-CoV-2 infection, do not rule out co-infections with other pathogens, and should not be used as the sole basis for treatment or other patient management decisions. Negative results must be combined with clinical observations, patient history, and epidemiological information. The expected result is Negative.  Fact Sheet for Patients: HairSlick[employer]no  Fact Sheet for Healthcare Providers: quierodirigir.com  This test is not yet approved or cleared by the [place] FDA and  has been authorized for detection and/or diagnosis of SARS-CoV-2 by FDA under an Emergency Use Authorization (EUA). This EUA will remain  in effect (meaning this test can be used) for the duration of the COVID-19 declaration under Se ction 564(b)(1) of the Act, 21 U.S.C. section 360bbb-3(b)(1), unless the authorization is terminated  or revoked sooner.  Performed at [facility], [address], [place], [place] [address]      Radiology Studies: No results found.   LOS: 2 days   Lanae Boast, MD [facility]  03/14/2021, 1:09 PM

## 2021-03-15 ENCOUNTER — Inpatient Hospital Stay (HOSPITAL_COMMUNITY): Payer: Self-pay

## 2021-03-15 DIAGNOSIS — R008 Other abnormalities of heart beat: Secondary | ICD-10-CM

## 2021-03-15 LAB — GLUCOSE, CAPILLARY
Glucose-Capillary: 81 mg/dL (ref 70–99)
Glucose-Capillary: 88 mg/dL (ref 70–99)
Glucose-Capillary: 109 mg/dL — ABNORMAL HIGH (ref 70–99)
Glucose-Capillary: 160 mg/dL — ABNORMAL HIGH (ref 70–99)

## 2021-03-15 LAB — ECHOCARDIOGRAM COMPLETE
AR max vel: 3.79 cm2
AV Area VTI: 3.62 cm2
AV Area mean vel: 3.46 cm2
AV Mean grad: 2 mmHg
AV Peak grad: 3.9 mmHg
Ao pk vel: 0.99 m/s
Area-P 1/2: 3.86 cm2
Height: 70 in
S' Lateral: 3.3 cm
Single Plane A4C EF: 64 %
Weight: 2240 oz

## 2021-03-15 LAB — TSH: TSH: 2.616 u[IU]/mL (ref 0.350–4.500)

## 2021-03-15 MED ORDER — INSULIN GLARGINE-YFGN 100 UNIT/ML ~~LOC~~ SOLN
10.0000 [IU] | Freq: Every day | SUBCUTANEOUS | Status: DC
  Filled 2021-03-15: qty 0.1

## 2021-03-15 MED ORDER — TRESIBA FLEXTOUCH 100 UNIT/ML ~~LOC~~ SOPN: 18.0000 [IU] | PEN_INJECTOR | Freq: Every day | SUBCUTANEOUS | Status: AC

## 2021-03-15 MED ORDER — LACTATED RINGERS IV BOLUS
1000.0000 mL | Freq: Once | INTRAVENOUS | Status: AC
  Administered 2021-03-15: 1000 mL via INTRAVENOUS

## 2021-03-15 NOTE — Progress Notes (Signed)
PROGRESS NOTE    Craig Solis  HCW:[id number] DOB: June 30, 1996 DOA: 03/12/2021 PCP: Patient, No Pcp Per (Inactive)   Chief Complaint  Patient presents with   Hyperglycemia   Brief Narrative: 25 year old male with history of type 1 diabetes on long-term insulin who had excessive alcohol drinking for his birthday party and next morning woke up not feeling well having stomachache nausea vomiting multiple episodes seen in the ED, found to have DKA and was admitted. Managed IV fluids IV insulin transition to subcu insulin.  Placed on diet.  At this time DKA resolved blood sugar is stable he has enough insulin at home.  Mild heart wound present suspect gastritis due to alcohol use, will prescribe Protonix and take over-the-counter Maalox.  He had leukocytosis but resolved.   Subjective: Seen this morning.  He is waiting for breakfast.  Later he was able to eat.  But tachycardic with ambulation also hypotensive orthostatic hypotension and symptomatic with dizziness.  Assessment & Plan:  25 year old male with history of type 1 diabetes on long-term insulin who had excessive alcohol drinking for his birthday party and next morning woke up not feeling well having stomachache nausea vomiting multiple episodes seen in the ED, found to have DKA and was admitted.Managed IV fluids IV insulin transition to subcu insulin.  Placed on diet.  At this time DKA resolved blood sugar is stable he has enough insulin at home.  Mild heart wound present suspect gastritis due to alcohol use, will prescribe Protonix and take over-the-counter Maalox.  He had leukocytosis but resolved. Patient had difficulty with getting up was Dizzy also orthostatic.  He was given aggressive IV fluid boluses and kept additional night in the hospital.  He is tolerating p.o. well at this time ambulatory no more dizzy.  Orthostatic checked and still symptomatic  Subjective Seen and examined this morning.  Patient is alert awake reports she  is less dizzy on ambulation to the bathroom however and orthostatic was positive and symptomatic.  Tolerating p.o.  No other new complaints.  Assessment and plan DKA, type 1, not at goal with uncontrolled hyperglycemia Type 1 diabetes on long-term insulin: DKA triggered by excessive alcohol.  DKA Resolved.  Uncontrolled hyperglycemia with A1c 11.4.  He can continue at least half of his insulin at home a blood sugar on lower side.  He will monitor blood sugar QA CHS at home.  Tolerating diet, this morning falls patient reading sugar less than 10.  Recheck was 88.  Lantus held today.   Recent Labs  Lab 03/14/21 2151 03/15/21 0627 03/15/21 0749 03/15/21 0757 03/15/21 1134  GLUCAP 118* 81 <10* 88 109*    Dehydration Orthostatic hypotension and tachycardia: Suspect from volume depletion in the setting of DKA with poor oral intake.  Given aggressive IV boluses, kept on continuous IV fluids overnight, resolved.  Orthostatic vitals checked 9/2 morning BP:117/82 on lying> 96/68 , hr 120  sitting up > on standing- in 70s and symptomatic again- we will do bolus 1liter, check Echo. Add ted hose. Check UA. Check Echo.Check am cortisol, tsh.  Stomach upset, likley gastritis from alcohol use.  Protonix for 2 weeks.  OTC Maalox Leukocytosis likely from dehydration, resolved  Alcohol use for birthday, apparently not drinking otherwise.:  Thiamine folate supportive care . Counseling.  Diet Order             Diet Carb Modified Fluid consistency: Thin; Room service appropriate? Yes  Diet effective now  Patient's Body mass index is 20.09 kg/m. DVT prophylaxis: SCD. Code Status:   Code Status: Full Code  Family Communication: plan of care discussed with patient at bedside. Status is: Inpatient  Remains inpatient appropriate because:IV treatments appropriate due to intensity of illness or inability to take PO and Inpatient level of care appropriate due to severity of  illness  Dispo: The patient is from: Home              Anticipated d/c is to: Home              Patient currently is not medically stable to d/c.   Difficult to place patient No  Unresulted Labs (From admission, onward)    None      Medications reviewed:  Scheduled Meds:  folic acid  1 mg Oral Daily   insulin aspart  0-5 Units Subcutaneous QHS   insulin aspart  0-9 Units Subcutaneous TID WC   insulin aspart  4 Units Subcutaneous TID WC   [START ON 03/16/2021] insulin glargine-yfgn  10 Units Subcutaneous Daily   metoCLOPramide (REGLAN) injection  5 mg Intravenous Q6H   multivitamin with minerals  1 tablet Oral Daily   pantoprazole  40 mg Oral Daily   sucralfate  1 g Oral TID WC & HS   thiamine  100 mg Oral Daily   Continuous Infusions:  lactated ringers 150 mL/hr at 03/15/21 0606   Consultants:see note  Procedures:see note Antimicrobials: Anti-infectives (From admission, onward)    None      Culture/Microbiology No results found for: SDES, SPECREQUEST, CULT, REPTSTATUS  Other culture-see note  Objective: Vitals: Today's Vitals   03/14/21 2349 03/15/21 0310 03/15/21 0753 03/15/21 0942  BP: 118/83 118/85 116/82   Pulse: 80 74 71   Resp: 18 18 15    Temp: 98.8 F (37.1 C) 98.7 F (37.1 C) 98.4 F (36.9 C)   TempSrc: Oral Oral Oral   SpO2: 100% 99% 100%   Weight:      Height:      PainSc:    0-No pain    Intake/Output Summary (Last 24 hours) at 03/15/2021 1307 Last data filed at 03/15/2021 1100 Gross per 24 hour  Intake 2571.94 ml  Output 1325 ml  Net 1246.94 ml   Filed Weights   03/12/21 1917  Weight: 63.5 kg   Weight change:   Intake/Output from previous day: 09/01 0701 - 09/02 0700 In: 3666.9 [P.O.:575; I.V.:1712.8; IV Piggyback:1379.2] Out: 975 [Urine:975] Intake/Output this shift: Total I/O In: 240 [P.O.:240] Out: 350 [Urine:350] Filed Weights   03/12/21 1917  Weight: 63.5 kg   Examination: General exam: AAOx 3,older than stated age, weak  appearing. HEENT:Oral mucosa moist, Ear/Nose WNL grossly, dentition normal. Respiratory system: bilaterally diminished,  no use of accessory muscle Cardiovascular system: S1 & S2 +, No JVD,. Gastrointestinal system: Abdomen soft, NT,ND, BS+ Nervous System:Alert, awake, moving extremities and grossly nonfocal Extremities: no edema, distal peripheral pulses palpable.  Skin: No rashes,no icterus. MSK: Normal muscle bulk,tone, power .  Data Reviewed: I have personally reviewed following labs and imaging studies CBC: Recent Labs  Lab 03/12/21 1921 03/13/21 0414 03/14/21 0614  WBC 12.5* 11.4* 8.2  HGB 13.4 12.9* 13.1  HCT 42.1 40.8 41.1  MCV 84.4 83.8 83.9  PLT 342 328 300   Basic Metabolic Panel: Recent Labs  Lab 03/12/21 1921 03/12/21 2335 03/13/21 0414 03/13/21 0700 03/14/21 0614  NA 138 136 137 137 136  K 4.6 3.4* 3.5 3.8 3.5  CL 98  101 101 99 97*  CO2 21* 22 25 27 26   GLUCOSE 374* 191* 171* 197* 227*  BUN 26* 25* 20 16 14   CREATININE 0.92 0.90 0.70 0.66 0.58*  CALCIUM 9.7 9.6 9.9 9.6 9.3   GFR: Estimated Creatinine Clearance: 124.6 mL/min (A) (by C-G formula based on SCr of 0.58 mg/dL (L)). Liver Function Tests: No results for input(s): AST, ALT, ALKPHOS, BILITOT, PROT, ALBUMIN in the last 168 hours. No results for input(s): LIPASE, AMYLASE in the last 168 hours. No results for input(s): AMMONIA in the last 168 hours. Coagulation Profile: No results for input(s): INR, PROTIME in the last 168 hours. Cardiac Enzymes: No results for input(s): CKTOTAL, CKMB, CKMBINDEX, TROPONINI in the last 168 hours. BNP (last 3 results) No results for input(s): PROBNP in the last 8760 hours. HbA1C: Recent Labs    03/12/21 2335  HGBA1C 11.4*   CBG: Recent Labs  Lab 03/14/21 2151 03/15/21 0627 03/15/21 0749 03/15/21 0757 03/15/21 1134  GLUCAP 118* 81 <10* 88 109*   Lipid Profile: No results for input(s): CHOL, HDL, LDLCALC, TRIG, CHOLHDL, LDLDIRECT in the last 72  hours. Thyroid Function Tests: Recent Labs    03/14/21 1406  TSH 1.980   Anemia Panel: No results for input(s): VITAMINB12, FOLATE, FERRITIN, TIBC, IRON, RETICCTPCT in the last 72 hours. Sepsis Labs: No results for input(s): PROCALCITON, LATICACIDVEN in the last 168 hours.  Recent Results (from the past 240 hour(s))  SARS CORONAVIRUS 2 (TAT 6-24 HRS) Nasopharyngeal Nasopharyngeal Swab     Status: None   Collection Time: 03/12/21 11:35 PM   Specimen: Nasopharyngeal Swab  Result Value Ref Range Status   SARS Coronavirus 2 NEGATIVE NEGATIVE Final    Comment: (NOTE) SARS-CoV-2 target nucleic acids are NOT DETECTED.  The SARS-CoV-2 RNA is generally detectable in upper and lower respiratory specimens during the acute phase of infection. Negative results do not preclude SARS-CoV-2 infection, do not rule out co-infections with other pathogens, and should not be used as the sole basis for treatment or other patient management decisions. Negative results must be combined with clinical observations, patient history, and epidemiological information. The expected result is Negative.  Fact Sheet for Patients: 05/14/21  Fact Sheet for Healthcare Providers: 03/14/21  This test is not yet approved or cleared by the [employer] FDA and  has been authorized for detection and/or diagnosis of SARS-CoV-2 by FDA under an Emergency Use Authorization (EUA). This EUA will remain  in effect (meaning this test can be used) for the duration of the COVID-19 declaration under Se ction 564(b)(1) of the Act, 21 U.S.C. section 360bbb-3(b)(1), unless the authorization is terminated or revoked sooner.  Performed at [facility], [address] [place], [address] [place]      Radiology Studies: No results found.   LOS: 3 days   [place], MD [facility]  03/15/2021, 1:07 PM

## 2021-03-15 NOTE — Progress Notes (Signed)
Patient request to leave. MD notified and advised patient to stay and complete workup. Patient refused and asked for AMA paper work. Patient called his ride. She arrived, Bolus of fluid administered, evening Meds administered, Telemetry removed and turned in, and both IVs removed. Patient signed out AMA and left.

## 2021-03-16 NOTE — Discharge Summary (Signed)
AMA  Patient expresses desire to leave the Hospital immidiately, patient has been warned that this is not Medically advisable at this time, and can result in Medical complications like passing out, Death and Disability, patient understands and accepts the risks involved and assumes full responsibilty of this decision. I made my best effort to convince him to stay.  Lanae Boast M.D on 03/16/2021 at 11:42 AM  [facility]  Time < 30 minutes  Last Note Below    PROGRESS NOTE    Craig Solis  AYO:[id number] DOB: [birth year] DOA: 03/12/2021 PCP: Patient, No Pcp Per (Inactive)   Chief Complaint  Patient presents with   Hyperglycemia   Brief Narrative: 25 year old male with history of type 1 diabetes on long-term insulin who had excessive alcohol drinking for his birthday party and next morning woke up not feeling well having stomachache nausea vomiting multiple episodes seen in the ED, found to have DKA and was admitted. Managed IV fluids IV insulin transition to subcu insulin.  Placed on diet.  At this time DKA resolved blood sugar is stable he has enough insulin at home.  Mild heart wound present suspect gastritis due to alcohol use, will prescribe Protonix and take over-the-counter Maalox.  He had leukocytosis but resolved.   Subjective: Seen this morning.  He is waiting for breakfast.  Later he was able to eat.  But tachycardic with ambulation also hypotensive orthostatic hypotension and symptomatic with dizziness.  Assessment & Plan:  25 year old male with history of type 1 diabetes on long-term insulin who had excessive alcohol drinking for his birthday party and next morning woke up not feeling well having stomachache nausea vomiting multiple episodes seen in the ED, found to have DKA and was admitted.Managed IV fluids IV insulin  transition to subcu insulin.  Placed on diet.  At this time DKA resolved blood sugar is stable he has enough insulin at home.  Mild heart wound present suspect gastritis due to alcohol use, will prescribe Protonix and take over-the-counter Maalox.  He had leukocytosis but resolved. Patient had difficulty with getting up was Dizzy also orthostatic.  He was given aggressive IV fluid boluses and kept additional night in the hospital.  He is tolerating p.o. well at this time ambulatory no more dizzy.  Orthostatic checked and still symptomatic  Subjective Seen and examined this morning.  Patient is alert awake reports she is less dizzy on ambulation to the bathroom however and orthostatic was positive and symptomatic.  Tolerating p.o.  No other new complaints.  Assessment and plan DKA, type 1, not at goal with uncontrolled hyperglycemia Type 1 diabetes on long-term insulin: DKA triggered by excessive alcohol.  DKA Resolved.  Uncontrolled hyperglycemia with A1c 11.4.  He can continue at least half of his insulin at home a blood sugar on lower side.  He will monitor blood sugar QA CHS at home.  Tolerating diet, this morning falls patient reading sugar less than 10.  Recheck was 88.  Lantus held today.   Recent Labs  Lab 03/15/21 0627 03/15/21 0749 03/15/21 0757 03/15/21 1134 03/15/21 1625  GLUCAP 81 <10* 88 109* 160*    Dehydration Orthostatic hypotension and tachycardia: Suspect  from volume depletion in the setting of DKA with poor oral intake.  Given aggressive IV boluses, kept on continuous IV fluids overnight, resolved.  Orthostatic vitals checked 9/2 morning BP:117/82 on lying> 96/68 , hr 120  sitting up > on standing- in 70s and symptomatic again- we will do bolus 1liter, check Echo. Add ted hose. Check UA. Check Echo.Check am cortisol, tsh.  Stomach upset, likley gastritis from alcohol use.  Protonix for 2 weeks.  OTC Maalox Leukocytosis likely from dehydration, resolved  Alcohol use for  birthday, apparently not drinking otherwise.:  Thiamine folate supportive care . Counseling.  Diet Order     None      Patient's Body mass index is 20.09 kg/m. DVT prophylaxis: SCD. Code Status:   Code Status: Prior  Family Communication: plan of care discussed with patient at bedside. Status is: Inpatient  Remains inpatient appropriate because:IV treatments appropriate due to intensity of illness or inability to take PO and Inpatient level of care appropriate due to severity of illness  Dispo: The patient is from: Home              Anticipated d/c is to: Home              Patient currently is not medically stable to d/c.   Difficult to place patient No  Unresulted Labs (From admission, onward)    None      Medications reviewed:  Scheduled Meds: REM  Continuous Infusions: REM  Consultants:see note  Procedures:see note Antimicrobials: Anti-infectives (From admission, onward)    None      Culture/Microbiology No results found for: SDES, SPECREQUEST, CULT, REPTSTATUS  Other culture-see note  Objective: Vitals: Today's Vitals   03/14/21 2349 03/15/21 0310 03/15/21 0753 03/15/21 0942  BP: 118/83 118/85 116/82   Pulse: 80 74 71   Resp: 18 18 15    Temp: 98.8 F (37.1 C) 98.7 F (37.1 C) 98.4 F (36.9 C)   TempSrc: Oral Oral Oral   SpO2: 100% 99% 100%   Weight:      Height:      PainSc:    0-No pain   No intake or output data in the 24 hours ending 03/16/21 1142  Filed Weights   03/12/21 1917  Weight: 63.5 kg   Weight change:   Intake/Output from previous day: 09/02 0701 - 09/03 0700 In: 240 [P.O.:240] Out: 350 [Urine:350] Intake/Output this shift: Total I/O In: 3806.9 [P.O.:715; I.V.:1712.8; IV Piggyback:1379.2] Out: 1325 [Urine:1325] Filed Weights   03/12/21 1917  Weight: 63.5 kg   Examination: General exam: AAOx 3,older than stated age, weak appearing. HEENT:Oral mucosa moist, Ear/Nose WNL grossly, dentition normal. Respiratory system:  bilaterally diminished,  no use of accessory muscle Cardiovascular system: S1 & S2 +, No JVD,. Gastrointestinal system: Abdomen soft, NT,ND, BS+ Nervous System:Alert, awake, moving extremities and grossly nonfocal Extremities: no edema, distal peripheral pulses palpable.  Skin: No rashes,no icterus. MSK: Normal muscle bulk,tone, power .  Data Reviewed: I have personally reviewed following labs and imaging studies CBC: Recent Labs  Lab 03/12/21 1921 03/13/21 0414 03/14/21 0614  WBC 12.5* 11.4* 8.2  HGB 13.4 12.9* 13.1  HCT 42.1 40.8 41.1  MCV 84.4 83.8 83.9  PLT 342 328 300   Basic Metabolic Panel: Recent Labs  Lab 03/12/21 1921 03/12/21 2335 03/13/21 0414 03/13/21 0700 03/14/21 0614  NA 138 136 137 137 136  K 4.6 3.4* 3.5 3.8 3.5  CL 98 101 101 99 97*  CO2 21* 22 25 27  26  GLUCOSE 374* 191* 171* 197* 227*  BUN 26* 25* 20 16 14   CREATININE 0.92 0.90 0.70 0.66 0.58*  CALCIUM 9.7 9.6 9.9 9.6 9.3   GFR: Estimated Creatinine Clearance: 124.6 mL/min (A) (by C-G formula based on SCr of 0.58 mg/dL (L)). Liver Function Tests: No results for input(s): AST, ALT, ALKPHOS, BILITOT, PROT, ALBUMIN in the last 168 hours. No results for input(s): LIPASE, AMYLASE in the last 168 hours. No results for input(s): AMMONIA in the last 168 hours. Coagulation Profile: No results for input(s): INR, PROTIME in the last 168 hours. Cardiac Enzymes: No results for input(s): CKTOTAL, CKMB, CKMBINDEX, TROPONINI in the last 168 hours. BNP (last 3 results) No results for input(s): PROBNP in the last 8760 hours. HbA1C: No results for input(s): HGBA1C in the last 72 hours.  CBG: Recent Labs  Lab 03/15/21 0627 03/15/21 0749 03/15/21 0757 03/15/21 1134 03/15/21 1625  GLUCAP 81 <10* 88 109* 160*   Lipid Profile: No results for input(s): CHOL, HDL, LDLCALC, TRIG, CHOLHDL, LDLDIRECT in the last 72 hours. Thyroid Function Tests: Recent Labs    03/15/21 1403  TSH 2.616   Anemia Panel: No  results for input(s): VITAMINB12, FOLATE, FERRITIN, TIBC, IRON, RETICCTPCT in the last 72 hours. Sepsis Labs: No results for input(s): PROCALCITON, LATICACIDVEN in the last 168 hours.  Recent Results (from the past 240 hour(s))  SARS CORONAVIRUS 2 (TAT 6-24 HRS) Nasopharyngeal Nasopharyngeal Swab     Status: None   Collection Time: 03/12/21 11:35 PM   Specimen: Nasopharyngeal Swab  Result Value Ref Range Status   SARS Coronavirus 2 NEGATIVE NEGATIVE Final    Comment: (NOTE) SARS-CoV-2 target nucleic acids are NOT DETECTED.  The SARS-CoV-2 RNA is generally detectable in upper and lower respiratory specimens during the acute phase of infection. Negative results do not preclude SARS-CoV-2 infection, do not rule out co-infections with other pathogens, and should not be used as the sole basis for treatment or other patient management decisions. Negative results must be combined with clinical observations, patient history, and epidemiological information. The expected result is Negative.  Fact Sheet for Patients: 03/14/21  Fact Sheet for Healthcare Providers: HairSlick.no  This test is not yet approved or cleared by the [employer]ir[employer] FDA and  has been authorized for detection and/or diagnosis of SARS-CoV-2 by FDA under an Emergency Use Authorization (EUA). This EUA will remain  in effect (meaning this test can be used) for the duration of the COVID-19 declaration under Se ction 564(b)(1) of the Act, 21 U.S.C. section 360bbb-3(b)(1), unless the authorization is terminated or revoked sooner.  Performed at [facility], [address] [place], [place]      Radiology Studies: ECHOCARDIOGRAM COMPLETE  Result Date: 03/15/2021    ECHOCARDIOGRAM REPORT   Patient Name:   [facility] Date of Exam: 03/15/2021 Medical Rec #:  05/15/2021        Height:       70.0 in Accession #:    [id number]       Weight:        140.0 lb Date of Birth:  1996/04/05        BSA:          1.794 m Patient Age:    25 years         BP:           134/92 mmHg Patient Gender: M  HR:           71 bpm. Exam Location:  Inpatient Procedure: 2D Echo, Cardiac Doppler and Color Doppler Indications:    R00.8 Other abnormalities of heart beat  History:        Patient has no prior history of Echocardiogram examinations.  Sonographer:    MH Referring Phys: [id number] Saylor Sheckler IMPRESSIONS  1. Left ventricular ejection fraction, by estimation, is 55 to 60%. The left ventricle has normal function. The left ventricle has no regional wall motion abnormalities. There is mild left ventricular hypertrophy. Left ventricular diastolic parameters were normal.  2. Right ventricular systolic function is normal. The right ventricular size is normal. There is normal pulmonary artery systolic pressure.  3. The mitral valve is normal in structure. No evidence of mitral valve regurgitation. No evidence of mitral stenosis.  4. The aortic valve is tricuspid. Aortic valve regurgitation is not visualized. No aortic stenosis is present.  5. The inferior vena cava is normal in size with greater than 50% respiratory variability, suggesting right atrial pressure of 3 mmHg. Comparison(s): No prior Echocardiogram. FINDINGS  Left Ventricle: Left ventricular ejection fraction, by estimation, is 55 to 60%. The left ventricle has normal function. The left ventricle has no regional wall motion abnormalities. The left ventricular internal cavity size was normal in size. There is  mild left ventricular hypertrophy. Left ventricular diastolic parameters were normal. Right Ventricle: The right ventricular size is normal. No increase in right ventricular wall thickness. Right ventricular systolic function is normal. There is normal pulmonary artery systolic pressure. The tricuspid regurgitant velocity is 1.38 m/s, and  with an assumed right atrial pressure of 3 mmHg, the estimated right  ventricular systolic pressure is 10.6 mmHg. Left Atrium: Left atrial size was normal in size. Right Atrium: Right atrial size was normal in size. Pericardium: There is no evidence of pericardial effusion. Mitral Valve: The mitral valve is normal in structure. No evidence of mitral valve regurgitation. No evidence of mitral valve stenosis. Tricuspid Valve: The tricuspid valve is normal in structure. Tricuspid valve regurgitation is trivial. No evidence of tricuspid stenosis. Aortic Valve: The aortic valve is tricuspid. Aortic valve regurgitation is not visualized. No aortic stenosis is present. Aortic valve mean gradient measures 2.0 mmHg. Aortic valve peak gradient measures 3.9 mmHg. Aortic valve area, by VTI measures 3.62 cm. Pulmonic Valve: The pulmonic valve was normal in structure. Pulmonic valve regurgitation is not visualized. No evidence of pulmonic stenosis. Aorta: The aortic root and ascending aorta are structurally normal, with no evidence of dilitation. Pulmonary Artery: The pulmonary artery is of normal size. Venous: The inferior vena cava is normal in size with greater than 50% respiratory variability, suggesting right atrial pressure of 3 mmHg. IAS/Shunts: The atrial septum is grossly normal.  LEFT VENTRICLE PLAX 2D LVIDd:         4.70 cm     Diastology LVIDs:         3.30 cm     LV e' medial:  5.98 cm/s LV PW:         1.10 cm     LV e' lateral: 14.30 cm/s LV IVS:        1.30 cm LVOT diam:     2.30 cm LV SV:         67 LV SV Index:   38 LVOT Area:     4.15 cm  LV Volumes (MOD) LV vol d, MOD A4C: 82.2 ml LV vol s, MOD A4C: 29.6  ml LV SV MOD A4C:     82.2 ml RIGHT VENTRICLE             IVC RV S prime:     13.40 cm/s  IVC diam: 1.80 cm TAPSE (M-mode): 2.0 cm LEFT ATRIUM           Index       RIGHT ATRIUM           Index LA diam:      2.40 cm 1.34 cm/m  RA Area:     14.00 cm LA Vol (A2C): 32.1 ml 17.90 ml/m RA Volume:   38.10 ml  21.24 ml/m LA Vol (A4C): 21.7 ml 12.10 ml/m  AORTIC VALVE                    PULMONIC VALVE AV Area (Vmax):    3.79 cm    PV Vmax:       0.76 m/s AV Area (Vmean):   3.46 cm    PV Peak grad:  2.3 mmHg AV Area (VTI):     3.62 cm AV Vmax:           99.10 cm/s AV Vmean:          67.900 cm/s AV VTI:            0.186 m AV Peak Grad:      3.9 mmHg AV Mean Grad:      2.0 mmHg LVOT Vmax:         90.40 cm/s LVOT Vmean:        56.600 cm/s LVOT VTI:          0.162 m LVOT/AV VTI ratio: 0.87  AORTA Ao Root diam: 3.50 cm Ao Asc diam:  3.40 cm MITRAL VALVE               TRICUSPID VALVE MV Area (PHT): 3.86 cm    TR Peak grad:   7.6 mmHg MV A velocity: 60.00 cm/s  TR Vmax:        138.00 cm/s                             SHUNTS                            Systemic VTI:  0.16 m                            Systemic Diam: 2.30 cm Riley LamMahesh Chandrasekhar MD Electronically signed by Riley LamMahesh Chandrasekhar MD Signature Date/Time: 03/15/2021/1:38:33 PM    Final      LOS: 3 days   Lanae Boastamesh Anand Tejada, MD [facility]  03/16/2021, 11:42 AM

## 2021-03-22 LAB — GLUCOSE, CAPILLARY: Glucose-Capillary: <10 mg/dL — CL (ref 70–99)

## 2022-01-01 IMAGING — DX DG CHEST 1V PORT
1 series · 1 of 1 positions shown · non-contrast
Comparison: December 08, 2019

CLINICAL DATA: Hyperglycemia

EXAM:
PORTABLE CHEST 1 VIEW

[chest ap]
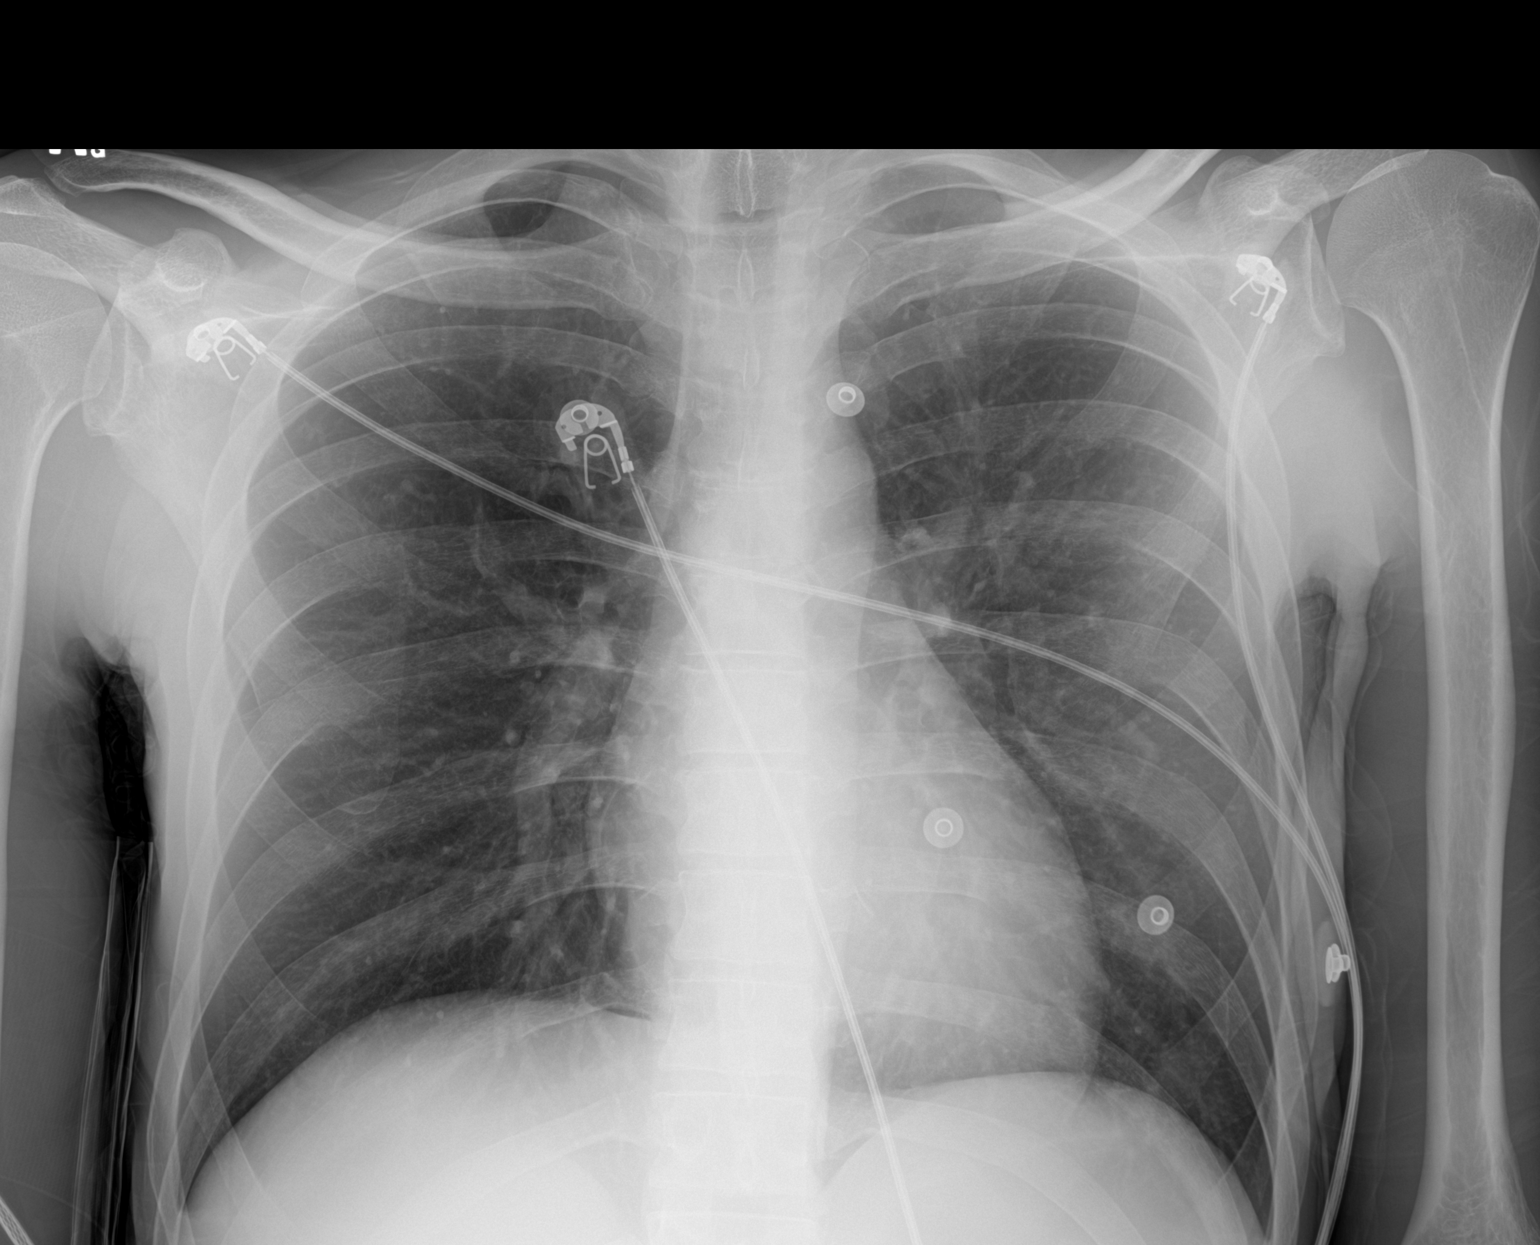

[1 of 1 positions shown; findings below may reference images not displayed]

FINDINGS: The lungs are clear. Heart size and pulmonary vascularity are
normal. No adenopathy. No bone lesions.
IMPRESSION: Lungs clear.  Cardiac silhouette normal.
